# Patient Record
Sex: Male | Born: 1970 | Race: White | Hispanic: No | Smoking: Former smoker
Health system: Southern US, Community
[De-identification: ages and names within clinical notes are randomized; demographics above are authoritative.]

## PROBLEM LIST (undated history)

## (undated) DIAGNOSIS — E119 Type 2 diabetes mellitus without complications: Secondary | ICD-10-CM

## (undated) DIAGNOSIS — N179 Acute kidney failure, unspecified: Secondary | ICD-10-CM

## (undated) DIAGNOSIS — S83509A Sprain of unspecified cruciate ligament of unspecified knee, initial encounter: Secondary | ICD-10-CM

## (undated) DIAGNOSIS — Z720 Tobacco use: Secondary | ICD-10-CM

## (undated) DIAGNOSIS — K219 Gastro-esophageal reflux disease without esophagitis: Secondary | ICD-10-CM

## (undated) DIAGNOSIS — E785 Hyperlipidemia, unspecified: Secondary | ICD-10-CM

## (undated) DIAGNOSIS — F329 Major depressive disorder, single episode, unspecified: Secondary | ICD-10-CM

## (undated) DIAGNOSIS — G4730 Sleep apnea, unspecified: Secondary | ICD-10-CM

## (undated) DIAGNOSIS — F32A Depression, unspecified: Secondary | ICD-10-CM

## (undated) DIAGNOSIS — I2119 ST elevation (STEMI) myocardial infarction involving other coronary artery of inferior wall: Secondary | ICD-10-CM

## (undated) DIAGNOSIS — IMO0002 Reserved for concepts with insufficient information to code with codable children: Secondary | ICD-10-CM

## (undated) HISTORY — PX: REDUCTION OF TORSION OF TESTIS: SUR1096

## (undated) HISTORY — DX: Sleep apnea, unspecified: G47.30

## (undated) HISTORY — PX: KNEE LIGAMENT RECONSTRUCTION: SHX1895

---

## 2001-06-18 ENCOUNTER — Emergency Department (HOSPITAL_COMMUNITY): Admission: EM | Admit: 2001-06-18 | Discharge: 2001-06-18 | Payer: Self-pay | Admitting: Emergency Medicine

## 2002-10-09 ENCOUNTER — Encounter: Payer: Self-pay | Admitting: Specialist

## 2002-10-09 ENCOUNTER — Ambulatory Visit (HOSPITAL_COMMUNITY): Admission: RE | Admit: 2002-10-09 | Discharge: 2002-10-09 | Payer: Self-pay | Admitting: Specialist

## 2007-03-01 ENCOUNTER — Encounter: Admission: RE | Admit: 2007-03-01 | Discharge: 2007-03-01 | Payer: Self-pay | Admitting: Neurosurgery

## 2007-03-01 ENCOUNTER — Encounter: Admission: RE | Admit: 2007-03-01 | Discharge: 2007-03-01 | Payer: Self-pay | Admitting: Internal Medicine

## 2009-06-11 ENCOUNTER — Emergency Department (HOSPITAL_COMMUNITY): Admission: EM | Admit: 2009-06-11 | Discharge: 2009-06-12 | Payer: Self-pay | Admitting: Emergency Medicine

## 2009-07-02 ENCOUNTER — Emergency Department (HOSPITAL_COMMUNITY): Admission: EM | Admit: 2009-07-02 | Discharge: 2009-07-03 | Payer: Self-pay | Admitting: Emergency Medicine

## 2009-07-03 ENCOUNTER — Ambulatory Visit (HOSPITAL_COMMUNITY): Admission: RE | Admit: 2009-07-03 | Discharge: 2009-07-03 | Payer: Self-pay | Admitting: Psychiatry

## 2009-07-03 ENCOUNTER — Inpatient Hospital Stay (HOSPITAL_COMMUNITY): Admission: EM | Admit: 2009-07-03 | Discharge: 2009-07-05 | Payer: Self-pay | Admitting: Psychiatry

## 2009-07-03 ENCOUNTER — Ambulatory Visit: Payer: Self-pay | Admitting: Psychiatry

## 2010-12-16 LAB — BASIC METABOLIC PANEL
BUN: 8 mg/dL (ref 6–23)
Chloride: 95 mEq/L — ABNORMAL LOW (ref 96–112)
Creatinine, Ser: 0.95 mg/dL (ref 0.4–1.5)

## 2010-12-16 LAB — URINALYSIS, ROUTINE W REFLEX MICROSCOPIC
Leukocytes, UA: NEGATIVE
Nitrite: NEGATIVE
Protein, ur: 30 mg/dL — AB
Urobilinogen, UA: 0.2 mg/dL (ref 0.0–1.0)

## 2010-12-16 LAB — CBC
MCV: 92.1 fL (ref 78.0–100.0)
Platelets: 247 10*3/uL (ref 150–400)
WBC: 6.7 10*3/uL (ref 4.0–10.5)

## 2010-12-16 LAB — DIFFERENTIAL
Basophils Absolute: 0 10*3/uL (ref 0.0–0.1)
Basophils Relative: 0 % (ref 0–1)
Eosinophils Absolute: 0 10*3/uL (ref 0.0–0.7)
Lymphs Abs: 1.4 10*3/uL (ref 0.7–4.0)
Neutrophils Relative %: 70 % (ref 43–77)

## 2010-12-16 LAB — ETHANOL: Alcohol, Ethyl (B): <5 mg/dL (ref 0–10)

## 2010-12-16 LAB — URINE MICROSCOPIC-ADD ON

## 2010-12-16 LAB — RAPID URINE DRUG SCREEN, HOSP PERFORMED: Tetrahydrocannabinol: NOT DETECTED

## 2010-12-17 LAB — POCT I-STAT, CHEM 8
Calcium, Ion: 1.1 mmol/L — ABNORMAL LOW (ref 1.12–1.32)
Chloride: 97 mEq/L (ref 96–112)
Creatinine, Ser: 1 mg/dL (ref 0.4–1.5)
Glucose, Bld: 101 mg/dL — ABNORMAL HIGH (ref 70–99)
HCT: 44 % (ref 39.0–52.0)
Potassium: 3.5 mEq/L (ref 3.5–5.1)

## 2010-12-17 LAB — URINALYSIS, ROUTINE W REFLEX MICROSCOPIC
Leukocytes, UA: NEGATIVE
Nitrite: NEGATIVE
Specific Gravity, Urine: 1.007 (ref 1.005–1.030)
Urobilinogen, UA: 0.2 mg/dL (ref 0.0–1.0)
pH: 5.5 (ref 5.0–8.0)

## 2010-12-17 LAB — CBC
HCT: 42.1 % (ref 39.0–52.0)
Hemoglobin: 14.6 g/dL (ref 13.0–17.0)
MCHC: 34.7 g/dL (ref 30.0–36.0)
RBC: 4.57 MIL/uL (ref 4.22–5.81)
RDW: 11.8 % (ref 11.5–15.5)

## 2010-12-17 LAB — DIFFERENTIAL
Basophils Relative: 1 % (ref 0–1)
Lymphs Abs: 1.8 10*3/uL (ref 0.7–4.0)
Monocytes Absolute: 0.5 10*3/uL (ref 0.1–1.0)
Monocytes Relative: 6 % (ref 3–12)
Neutro Abs: 5.8 10*3/uL (ref 1.7–7.7)

## 2010-12-17 LAB — RAPID URINE DRUG SCREEN, HOSP PERFORMED
Barbiturates: NOT DETECTED
Opiates: NOT DETECTED
Tetrahydrocannabinol: NOT DETECTED

## 2012-01-24 ENCOUNTER — Other Ambulatory Visit: Payer: Self-pay | Admitting: Orthopedic Surgery

## 2012-01-24 DIAGNOSIS — M25562 Pain in left knee: Secondary | ICD-10-CM

## 2012-01-27 ENCOUNTER — Ambulatory Visit
Admission: RE | Admit: 2012-01-27 | Discharge: 2012-01-27 | Disposition: A | Discharge status: Home / Self Care | Payer: Managed Care, Other (non HMO) | Source: Ambulatory Visit | Attending: Orthopedic Surgery | Admitting: Orthopedic Surgery

## 2012-01-27 ENCOUNTER — Other Ambulatory Visit: Payer: Self-pay

## 2012-01-27 DIAGNOSIS — M25562 Pain in left knee: Secondary | ICD-10-CM

## 2012-02-11 DIAGNOSIS — IMO0002 Reserved for concepts with insufficient information to code with codable children: Secondary | ICD-10-CM

## 2012-02-11 DIAGNOSIS — S83509A Sprain of unspecified cruciate ligament of unspecified knee, initial encounter: Secondary | ICD-10-CM

## 2012-02-11 HISTORY — DX: Reserved for concepts with insufficient information to code with codable children: IMO0002

## 2012-02-11 HISTORY — DX: Sprain of unspecified cruciate ligament of unspecified knee, initial encounter: S83.509A

## 2012-02-16 ENCOUNTER — Encounter (HOSPITAL_BASED_OUTPATIENT_CLINIC_OR_DEPARTMENT_OTHER): Payer: Self-pay | Admitting: *Deleted

## 2012-02-17 ENCOUNTER — Encounter (HOSPITAL_BASED_OUTPATIENT_CLINIC_OR_DEPARTMENT_OTHER): Payer: Self-pay | Admitting: *Deleted

## 2012-02-22 NOTE — H&P (Signed)
[facility] [address]   [address] [place], [place] [address] [phone] A Division of [facility]  Loreta Ave, M.D.     Robert A. Thurston Hole, M.D.     Lunette Stands, M.D. Eulas Post, M.D.    Buford Dresser, M.D. Estell Harpin, M.D. Ralene Cork, D.O.          Genene Churn. Barry Dienes, PA-C            Kirstin A. Shepperson, PA-C Janace Litten, OPA-C   RE: Albert Villegas, Albert Villegas                                [id number]      DOB: 07/12/1971 INITIAL EVALUATION:  01-24-12 Pratik presents as a new patient to the office for evaluation and treatment recommendation for his left knee.  Recent traumatic event coming down awkwardly with a twisting injury.  His knee pivoted at that time.  Previous history of slipping on ice back in 2002.  Anterior cruciate ligament tear diagnosis and evaluated with MRI scan at [facility].  Since that time he has had some degree of instability.  This has slowly gotten more pronounced over time.  He has now progressed to a point where he has episodes of his knee giving way, even turning fast with activities of daily living.  He has been trying to put off operative intervention, but it is getting unrelenting and now with his new acute traumatic event he is getting locking and medial mechanical symptoms.  Works as a [employer].  He comes in at this time for evaluation and treatment recommendation.  In talking with him he is realizing that something is going to have to be done because his degree of symptomatic instability is getting much worse.  Otherwise in excellent healthy. History is reviewed, updated and included in the chart.       EXAMINATION: General exam is outlined and included in the chart.  Specifically, neurovascularly intact in both lower extremities.  Negative log roll, both hips.  Negative straight leg raise, both sides.  Left knee is ACL deficient with a positive Lachman and  drawer.  Other ligaments stable.  Full motion.  Point tender medial joint line.  Positive medial McMurray's.  Right knee has excellent stability.  Full motion.  No meniscal signs.    X-RAYS: Four view standing x-ray shows really a paucity of degenerative changes.  Excellent bone stock.  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  Now I think he probably has a medial meniscus tear.  He is struggling with taking time off of work to address this.  I am going to get an MRI scan to see what he has done recently.  If he has torn up his medial meniscus this is going to have to be addressed and if our hand is forced from a point of view of operative treatment, my overall recommendation would be to go ahead and do an allograft reconstruction.  He understands and agrees.  He is going to call me after the MRI is complete to make a final decision.  In the interim we have discussed allograft reconstruction, as well as meniscectomy versus meniscus repair.  Procedures, risks, benefits and complications reviewed and discussed in detail.  Paperwork complete.  All questions answered.  We have talked about how long he needs to be out of work  and how long he has to be careful after reconstruction.  All questions answered.  Final decision after we see his scan.  Continue to be cautious with turning in the interim.     Loreta Ave, M.D.   Electronically verified by Loreta Ave, M.D. DFM:jjh D 01-24-12 T 01-24-12

## 2012-02-23 ENCOUNTER — Ambulatory Visit (HOSPITAL_BASED_OUTPATIENT_CLINIC_OR_DEPARTMENT_OTHER): Payer: Managed Care, Other (non HMO) | Admitting: Anesthesiology

## 2012-02-23 ENCOUNTER — Encounter (HOSPITAL_BASED_OUTPATIENT_CLINIC_OR_DEPARTMENT_OTHER): Payer: Self-pay

## 2012-02-23 ENCOUNTER — Encounter (HOSPITAL_BASED_OUTPATIENT_CLINIC_OR_DEPARTMENT_OTHER): Payer: Self-pay | Admitting: Anesthesiology

## 2012-02-23 ENCOUNTER — Ambulatory Visit (HOSPITAL_BASED_OUTPATIENT_CLINIC_OR_DEPARTMENT_OTHER)
Admission: RE | Admit: 2012-02-23 | Discharge: 2012-02-23 | Disposition: A | Discharge status: Home / Self Care | Payer: Managed Care, Other (non HMO) | Source: Ambulatory Visit | Attending: Orthopedic Surgery | Admitting: Orthopedic Surgery

## 2012-02-23 ENCOUNTER — Encounter (HOSPITAL_BASED_OUTPATIENT_CLINIC_OR_DEPARTMENT_OTHER)
Admission: RE | Disposition: A | Discharge status: Home / Self Care | Payer: Self-pay | Source: Ambulatory Visit | Attending: Orthopedic Surgery

## 2012-02-23 DIAGNOSIS — K219 Gastro-esophageal reflux disease without esophagitis: Secondary | ICD-10-CM | POA: Insufficient documentation

## 2012-02-23 DIAGNOSIS — Z4789 Encounter for other orthopedic aftercare: Secondary | ICD-10-CM

## 2012-02-23 DIAGNOSIS — S83289A Other tear of lateral meniscus, current injury, unspecified knee, initial encounter: Secondary | ICD-10-CM | POA: Insufficient documentation

## 2012-02-23 DIAGNOSIS — IMO0002 Reserved for concepts with insufficient information to code with codable children: Secondary | ICD-10-CM | POA: Insufficient documentation

## 2012-02-23 DIAGNOSIS — S83509A Sprain of unspecified cruciate ligament of unspecified knee, initial encounter: Secondary | ICD-10-CM | POA: Insufficient documentation

## 2012-02-23 DIAGNOSIS — X500XXA Overexertion from strenuous movement or load, initial encounter: Secondary | ICD-10-CM | POA: Insufficient documentation

## 2012-02-23 HISTORY — DX: Sprain of unspecified cruciate ligament of unspecified knee, initial encounter: S83.509A

## 2012-02-23 HISTORY — DX: Gastro-esophageal reflux disease without esophagitis: K21.9

## 2012-02-23 HISTORY — DX: Depression, unspecified: F32.A

## 2012-02-23 HISTORY — DX: Reserved for concepts with insufficient information to code with codable children: IMO0002

## 2012-02-23 HISTORY — DX: Major depressive disorder, single episode, unspecified: F32.9

## 2012-02-23 LAB — POCT HEMOGLOBIN-HEMACUE: Hemoglobin: 15.6 g/dL (ref 13.0–17.0)

## 2012-02-23 SURGERY — KNEE ARTHROSCOPY WITH ANTERIOR CRUCIATE LIGAMENT (ACL) REPAIR
Anesthesia: General | Site: Knee | Laterality: Left

## 2012-02-23 MED ORDER — PROPOFOL 10 MG/ML IV EMUL
INTRAVENOUS | Status: DC | PRN
  Administered 2012-02-23: 300 mg via INTRAVENOUS

## 2012-02-23 MED ORDER — LACTATED RINGERS IV SOLN
INTRAVENOUS | Status: DC
  Administered 2012-02-23 (×2): via INTRAVENOUS

## 2012-02-23 MED ORDER — FENTANYL CITRATE 0.05 MG/ML IJ SOLN
50.0000 ug | INTRAMUSCULAR | Status: DC | PRN
  Administered 2012-02-23: 100 ug via INTRAVENOUS

## 2012-02-23 MED ORDER — ONDANSETRON HCL 4 MG/2ML IJ SOLN
INTRAMUSCULAR | Status: DC | PRN
  Administered 2012-02-23: 4 mg via INTRAVENOUS

## 2012-02-23 MED ORDER — BUPIVACAINE HCL (PF) 0.5 % IJ SOLN
INTRAMUSCULAR | Status: DC | PRN
  Administered 2012-02-23: 25 mL

## 2012-02-23 MED ORDER — OXYCODONE-ACETAMINOPHEN 5-325 MG PO TABS
1.0000 | ORAL_TABLET | ORAL | Status: DC | PRN
  Administered 2012-02-23: 1 via ORAL

## 2012-02-23 MED ORDER — DROPERIDOL 2.5 MG/ML IJ SOLN
INTRAMUSCULAR | Status: DC | PRN
  Administered 2012-02-23: 0.625 mg via INTRAVENOUS

## 2012-02-23 MED ORDER — HYDROMORPHONE HCL PF 1 MG/ML IJ SOLN
0.2500 mg | INTRAMUSCULAR | Status: DC | PRN
  Administered 2012-02-23 (×2): 0.5 mg via INTRAVENOUS

## 2012-02-23 MED ORDER — CEFAZOLIN SODIUM-DEXTROSE 2-3 GM-% IV SOLR
2.0000 g | INTRAVENOUS | Status: AC
  Administered 2012-02-23: 2 g via INTRAVENOUS

## 2012-02-23 MED ORDER — DEXAMETHASONE SODIUM PHOSPHATE 4 MG/ML IJ SOLN
INTRAMUSCULAR | Status: DC | PRN
  Administered 2012-02-23: 10 mg via INTRAVENOUS

## 2012-02-23 MED ORDER — FENTANYL CITRATE 0.05 MG/ML IJ SOLN
INTRAMUSCULAR | Status: DC | PRN
  Administered 2012-02-23: 100 ug via INTRAVENOUS
  Administered 2012-02-23: 50 ug via INTRAVENOUS

## 2012-02-23 MED ORDER — METOCLOPRAMIDE HCL 5 MG/ML IJ SOLN: 10.0000 mg | Freq: Once | INTRAMUSCULAR | Status: DC | PRN

## 2012-02-23 MED ORDER — MIDAZOLAM HCL 2 MG/2ML IJ SOLN
0.5000 mg | INTRAMUSCULAR | Status: DC | PRN
  Administered 2012-02-23: 2 mg via INTRAVENOUS

## 2012-02-23 MED ORDER — SODIUM CHLORIDE 0.9 % IR SOLN
Status: DC | PRN
  Administered 2012-02-23: 3000 mL

## 2012-02-23 MED ORDER — OXYCODONE HCL 5 MG PO TABS: 5.0000 mg | ORAL_TABLET | Freq: Once | ORAL | Status: DC | PRN

## 2012-02-23 MED ORDER — LIDOCAINE HCL (CARDIAC) 20 MG/ML IV SOLN
INTRAVENOUS | Status: DC | PRN
  Administered 2012-02-23: 40 mg via INTRAVENOUS

## 2012-02-23 SURGICAL SUPPLY — 90 items
ANCH SUT PUSHLCK 19.5X3.5 STRL (Anchor) ×1 IMPLANT
ANCHOR PUSHLOCK PEEK 3.5X19.5 (Anchor) ×1 IMPLANT
APL SKNCLS STERI-STRIP NONHPOA (GAUZE/BANDAGES/DRESSINGS) ×1
BANDAGE ELASTIC 4 VELCRO ST LF (GAUZE/BANDAGES/DRESSINGS) IMPLANT
BANDAGE ELASTIC 6 VELCRO ST LF (GAUZE/BANDAGES/DRESSINGS) IMPLANT
BANDAGE ESMARK 6X9 LF (GAUZE/BANDAGES/DRESSINGS) ×1 IMPLANT
BENZOIN TINCTURE PRP APPL 2/3 (GAUZE/BANDAGES/DRESSINGS) ×2 IMPLANT
BIOSCREW 8X25 (Screw) ×2 IMPLANT
BIOSCREW 9X25 (Screw) ×2 IMPLANT
BIT DRILL 67X1.5XWRPS STRL (BIT) ×1 IMPLANT
BIT DRILL QC 3.5X195 (BIT) ×1 IMPLANT
BIT DRL 67X1.5XWRPS STRL (BIT) ×1
BLADE 4.2CUDA (BLADE) IMPLANT
BLADE AVERAGE 25X9 (BLADE) ×2 IMPLANT
BLADE CUDA 5.5 (BLADE) IMPLANT
BLADE CUDA GRT WHITE 3.5 (BLADE) IMPLANT
BLADE CUTTER GATOR 3.5 (BLADE) ×2 IMPLANT
BLADE CUTTER MENIS 5.5 (BLADE) IMPLANT
BLADE GREAT WHITE 4.2 (BLADE) ×2 IMPLANT
BLADE SURG 15 STRL LF DISP TIS (BLADE) ×1 IMPLANT
BLADE SURG 15 STRL SS (BLADE) ×2
BNDG CMPR 9X6 STRL LF SNTH (GAUZE/BANDAGES/DRESSINGS) ×1
BNDG ESMARK 6X9 LF (GAUZE/BANDAGES/DRESSINGS) ×2
BUR EGG 3PK/BX (BURR) ×2 IMPLANT
BUR OVAL 6.0 (BURR) ×2 IMPLANT
CANISTER OMNI JUG 16 LITER (MISCELLANEOUS) ×2 IMPLANT
CANISTER SUCTION 2500CC (MISCELLANEOUS) IMPLANT
CLOTH BEACON ORANGE TIMEOUT ST (SAFETY) ×2 IMPLANT
COVER TABLE BACK 60X90 (DRAPES) ×2 IMPLANT
CUFF TOURNIQUET SINGLE 34IN LL (TOURNIQUET CUFF) ×1 IMPLANT
CUTTER MENISCUS  4.2MM (BLADE)
CUTTER MENISCUS 4.2MM (BLADE) IMPLANT
DECANTER SPIKE VIAL GLASS SM (MISCELLANEOUS) IMPLANT
DRAPE ARTHROSCOPY W/POUCH 114 (DRAPES) ×2 IMPLANT
DRAPE U-SHAPE 47X51 STRL (DRAPES) ×2 IMPLANT
DRILL BIT WIRE PASS (BIT) ×2
DURAPREP 26ML APPLICATOR (WOUND CARE) ×2 IMPLANT
ELECT MENISCUS 165MM 90D (ELECTRODE) IMPLANT
ELECT REM PT RETURN 9FT ADLT (ELECTROSURGICAL) ×2
ELECTRODE REM PT RTRN 9FT ADLT (ELECTROSURGICAL) ×1 IMPLANT
GAUZE XEROFORM 1X8 LF (GAUZE/BANDAGES/DRESSINGS) IMPLANT
GLOVE BIO SURGEON STRL SZ 6.5 (GLOVE) ×1 IMPLANT
GLOVE BIOGEL M STRL SZ7.5 (GLOVE) ×1 IMPLANT
GLOVE BIOGEL PI IND STRL 8 (GLOVE) ×1 IMPLANT
GLOVE BIOGEL PI INDICATOR 8 (GLOVE) ×1
GLOVE INDICATOR 7.0 STRL GRN (GLOVE) ×1 IMPLANT
GLOVE ORTHO TXT STRL SZ7.5 (GLOVE) ×4 IMPLANT
GOWN BRE IMP PREV XXLGXLNG (GOWN DISPOSABLE) ×2 IMPLANT
GOWN PREVENTION PLUS XLARGE (GOWN DISPOSABLE) ×3 IMPLANT
GRAFT ACHILLES CALC BNE BLCK (Bone Implant) IMPLANT
GRAFT ACHILLES TENDON (Bone Implant) ×2 IMPLANT
IMMOBILIZER KNEE 22 UNIV (SOFTGOODS) ×1 IMPLANT
IMMOBILIZER KNEE 24 THIGH 36 (MISCELLANEOUS) ×1 IMPLANT
IMMOBILIZER KNEE 24 UNIV (MISCELLANEOUS) ×2
KNEE WRAP E Z 3 GEL PACK (MISCELLANEOUS) ×2 IMPLANT
KNIFE GRAFT ACL 10MM 5952 (MISCELLANEOUS) IMPLANT
KNIFE GRAFT ACL 9MM (MISCELLANEOUS) IMPLANT
NEEDLE MENISCAL REPAIR DBL ARM (NEEDLE) IMPLANT
NS IRRIG 1000ML POUR BTL (IV SOLUTION) ×2 IMPLANT
PACK ARTHROSCOPY DSU (CUSTOM PROCEDURE TRAY) ×2 IMPLANT
PACK BASIN DAY SURGERY FS (CUSTOM PROCEDURE TRAY) ×2 IMPLANT
PAD CAST 4YDX4 CTTN HI CHSV (CAST SUPPLIES) ×1 IMPLANT
PADDING CAST COTTON 4X4 STRL (CAST SUPPLIES) ×2
PADDING CAST COTTON 6X4 STRL (CAST SUPPLIES) ×2 IMPLANT
PASSER SUT SWANSON 36MM LOOP (INSTRUMENTS) ×1 IMPLANT
PENCIL BUTTON HOLSTER BLD 10FT (ELECTRODE) ×2 IMPLANT
SCREW BIO 8X25 (Screw) IMPLANT
SCREW BIO 9X25 (Screw) IMPLANT
SET ARTHROSCOPY TUBING (MISCELLANEOUS) ×2
SET ARTHROSCOPY TUBING LN (MISCELLANEOUS) ×1 IMPLANT
SLEEVE SCD COMPRESS KNEE MED (MISCELLANEOUS) ×1 IMPLANT
SPONGE GAUZE 4X4 12PLY (GAUZE/BANDAGES/DRESSINGS) ×4 IMPLANT
SPONGE LAP 4X18 X RAY DECT (DISPOSABLE) ×1 IMPLANT
STRIP CLOSURE SKIN 1/2X4 (GAUZE/BANDAGES/DRESSINGS) ×2 IMPLANT
SUCTION FRAZIER TIP 10 FR DISP (SUCTIONS) IMPLANT
SUT 2 FIBERLOOP 20 STRT BLUE (SUTURE) ×2
SUT ETHILON 3 0 PS 1 (SUTURE) IMPLANT
SUT FIBERWIRE #2 38 T-5 BLUE (SUTURE) ×8
SUT VIC AB 0 CT1 27 (SUTURE)
SUT VIC AB 0 CT1 27XBRD ANBCTR (SUTURE) IMPLANT
SUT VIC AB 2-0 SH 27 (SUTURE) ×2
SUT VIC AB 2-0 SH 27XBRD (SUTURE) ×1 IMPLANT
SUT VIC AB 3-0 SH 27 (SUTURE) ×2
SUT VIC AB 3-0 SH 27X BRD (SUTURE) ×1 IMPLANT
SUT VICRYL 4-0 PS2 18IN ABS (SUTURE) IMPLANT
SUTURE 2 FIBERLOOP 20 STRT BLU (SUTURE) ×1 IMPLANT
SUTURE FIBERWR #2 38 T-5 BLUE (SUTURE) ×4 IMPLANT
TOWEL OR 17X24 6PK STRL BLUE (TOWEL DISPOSABLE) ×2 IMPLANT
TOWEL OR NON WOVEN STRL DISP B (DISPOSABLE) ×2 IMPLANT
WATER STERILE IRR 1000ML POUR (IV SOLUTION) ×2 IMPLANT

## 2012-02-23 NOTE — Transfer of Care (Signed)
Immediate Anesthesia Transfer of Care Note  Patient: Albert Villegas  Procedure(s) Performed: Procedure(s) (LRB): KNEE ARTHROSCOPY WITH ANTERIOR CRUCIATE LIGAMENT (ACL) REPAIR (Left)  Patient Location: PACU  Anesthesia Type: GA combined with regional for post-op pain  Level of Consciousness: awake and oriented  Airway & Oxygen Therapy: Patient Spontanous Breathing and Patient connected to face mask  Post-op Assessment: Report given to PACU RN and Post -op Vital signs reviewed and stable  Post vital signs: Reviewed and stable  Complications: No apparent anesthesia complications

## 2012-02-23 NOTE — Discharge Instructions (Signed)
°  Post Anesthesia Home Care Instructions ° °Activity: °Get plenty of rest for the remainder of the day. A responsible adult should stay with you for 24 hours following the procedure.  °For the next 24 hours, DO NOT: °-Drive a car °-Operate machinery °-Drink alcoholic beverages °-Take any medication unless instructed by your physician °-Make any legal decisions or sign important papers. ° °Meals: °Start with liquid foods such as gelatin or soup. Progress to regular foods as tolerated. Avoid greasy, spicy, heavy foods. If nausea and/or vomiting occur, drink only clear liquids until the nausea and/or vomiting subsides. Call your physician if vomiting continues. ° °Special Instructions/Symptoms: °Your throat may feel dry or sore from the anesthesia or the breathing tube placed in your throat during surgery. If this causes discomfort, gargle with warm salt water. The discomfort should disappear within 24 hours. ° °Regional Anesthesia Blocks ° °1. Numbness or the inability to move the "blocked" extremity may last from 3-48 hours after placement. The length of time depends on the medication injected and your individual response to the medication. If the numbness is not going away after 48 hours, call your surgeon. ° °2. The extremity that is blocked will need to be protected until the numbness is gone and the  Strength has returned. Because you cannot feel it, you will need to take extra care to avoid injury. Because it may be weak, you may have difficulty moving it or using it. You may not know what position it is in without looking at it while the block is in effect. ° °3. For blocks in the legs and feet, returning to weight bearing and walking needs to be done carefully. You will need to wait until the numbness is entirely gone and the strength has returned. You should be able to move your leg and foot normally before you try and bear weight or walk. You will need someone to be with you when you first try to ensure you  do not fall and possibly risk injury. ° °4. Bruising and tenderness at the needle site are common side effects and will resolve in a few days. ° °5. Persistent numbness or new problems with movement should be communicated to the surgeon or the [facility] ([phone])/ [facility] ([phone]). °

## 2012-02-23 NOTE — Op Note (Signed)
NAMEKEVIN, SPACE NO.:  [id number]  MEDICAL RECORD NO.:  [id number]  LOCATION:                                 FACILITY:  PHYSICIAN:  Loreta Ave, M.D.      DATE OF BIRTH:  DATE OF PROCEDURE:  02/23/2012 DATE OF DISCHARGE:                              OPERATIVE REPORT   PREOPERATIVE DIAGNOSES:  Left knee acute on chronic anterior cruciate ligament tear with marked anterolateral rotary instability.  Medial and lateral meniscus tear.  POSTOPERATIVE DIAGNOSES:  Left knee acute on chronic anterior cruciate ligament tear with marked anterolateral rotary instability.  Medial and lateral meniscus tear.  Longitudinal tearing lateral meniscus, not peripheral, not reparable.  Posterior third.  Small under surface tear, posterior horn medial meniscus not displaceable.  PROCEDURE:  Left knee exam under anesthesia, arthroscopy.  Partial lateral meniscectomy.  Scarification of undersurface tear, medial meniscus.  Arthroscopic endoscopic ACL reconstruction utilizing Achilles allograft.  Bioabsorbable screw fixation above below.  Push lock fixation distally.  Notchplasty.  SURGEON:  Loreta Ave, MD  ASSISTANT:  Genene Churn. Denton Meek., present throughout the entire case, necessary for timely completion of procedure.  ANESTHESIA:  General.  BLOOD LOSS:  Minimal.  SPECIMENS:  None.  CULTURES:  None.  COMPLICATION:  None.  DRESSINGS:  Soft compressive knee immobilizer.  DRAINS:  Not utilized.  TOURNIQUET TIME:  45 minutes.  PROCEDURE:  The patient was brought to operating room, placed on the operating table in supine position.  After adequate anesthesia had been obtained, leg examined.  Positive Lachman, positive drawer, positive pivot shift.  Tourniquet applied.  Prepped and draped in usual sterile fashion.  Exsanguinated with elevation and Esmarch.  Tourniquet inflated to 350 mmHg.  Two portals, 1 each medial and lateral parapatellar. Arthroscope  introduced.  Knee distended and inspected.  Whatever was left to the ACL from previous injury is not completely gone, shredded. Really no functional tissue at all.  Debrided out.  A little narrowing of the notch not extreme.  Open notchplasty.  PCL intact.  Other compartments actually looked good.  Little grade 2 changes on patella, nothing marked.  Lateral meniscus at 2 linear tears out away from the back edge.  Posterior 3rd.  Taken down to a stable rim, tapered into remaining meniscus.  Medially there was a very small undersurface tear centimeter long that was just barely in the meniscus, not displaced at all.  Scarified.  I did not feel this was involved enough to require a suture or debridement.  A graft was prepared for 10 mm tunnels.  With the arthroscope in place, small incision next to tibial tubercle. Guidewire driven from there up through the footprint of the ACL. Overdrilled with a 10 mm reamer.  Femoral guide inserted across tibial tunnel notch and on the back cortex of femur.  K-wire driven. Overdrilled with 10 mm reamer for appropriate depth of the graft and pegs.  Debris cleared throughout the knee.  Tunnels assessed, found to be in good position.  Two pin Passer inserted across both tunnels and out through a stab wound anterolateral thigh.  Nitinol wire brought to the medial portal out  through the femoral tunnel.  Graft attached to tubing Passer, pulled in the bony part of the graft up into the femoral tunnel.  Fixed over Nitinol wire with an 8 x 25 bioabsorbable screw. Good capturing of fixation confirmed.  Knee placed at 70 degrees of flexion.  Graft tightened.  Fixed in the tibial tunnel with a 9 x 25 screw.  Exiting sutures then anchored into a predrilled 3.5 mm push lock anchor.  At completion, excellent capturing of fixation of graft above and below.  Good clearance of graft when viewed arthroscopically.  Grade stability.  All wounds irrigated and closed with  subcutaneous and subcuticular Vicryl.  Portals closed with nylon.  Sterile compressive dressing applied.  Tourniquet deflated and removed.  Knee immobilizer applied.  Anesthesia reversed.  Brought to recovery room.  Tolerated surgery well.  No complications.     Loreta Ave, M.D.     DFM/MEDQ  D:  02/23/2012  T:  02/23/2012  Job:  [id number]

## 2012-02-23 NOTE — Anesthesia Procedure Notes (Addendum)
Anesthesia Regional Block:  Femoral nerve block  Pre-Anesthetic Checklist: ,, timeout performed, Correct Patient, Correct Site, Correct Laterality, Correct Procedure, Correct Position, site marked, Risks and benefits discussed,  Surgical consent,  Pre-op evaluation,  At surgeon's request and post-op pain management  Laterality: Left  Prep: chloraprep       Needles:   Needle Type: Other   (Arrow Echogenic)   Needle Length: 9cm  Needle Gauge: 21    Additional Needles:  Procedures: ultrasound guided Femoral nerve block Narrative:  Start time: 02/23/2012 7:54 AM End time: 02/23/2012 8:02 AM Injection made incrementally with aspirations every 5 mL.  Performed by: Personally  Anesthesiologist: C. Frederick MD  Additional Notes: Ultrasound guidance used to: id relevant anatomy, confirm needle position, local anesthetic spread, avoidance of vascular puncture. Picture saved. No complications. Block performed personally by Janetta Hora. Gelene Mink, MD    Femoral nerve block Procedure Name: LMA Insertion Performed by: Lance Coon Pre-anesthesia Checklist: Patient identified, Timeout performed, Emergency Drugs available, Suction available and Patient being monitored Patient Re-evaluated:Patient Re-evaluated prior to inductionOxygen Delivery Method: Circle system utilized Preoxygenation: Pre-oxygenation with 100% oxygen Intubation Type: IV induction Ventilation: Mask ventilation without difficulty LMA: LMA inserted LMA Size: 5.0 Number of attempts: 1 Placement Confirmation: breath sounds checked- equal and bilateral and positive ETCO2 Dental Injury: Teeth and Oropharynx as per pre-operative assessment

## 2012-02-23 NOTE — Brief Op Note (Signed)
02/23/2012  10:22 AM  PATIENT:  Sofie Hartigan Loftus  41 y.o. male  PRE-OPERATIVE DIAGNOSIS:  left knee dislocation, tear of medial cartilage or meniscus; sprain/strain/tear knee cruciate ligament  POST-OPERATIVE DIAGNOSIS:  left knee dislocation, tear of medial cartilage or meniscus;  PROCEDURE:  Procedure(s) (LRB): KNEE ARTHROSCOPY WITH ANTERIOR CRUCIATE LIGAMENT (ACL) REPAIR (Left), and partial lat meniscectomy  SURGEON:  Surgeon(s) and Role:    * Loreta Ave, MD - Primary  PHYSICIAN ASSISTANT: Zonia Kief M   ANESTHESIA:   regional and general  EBL:  Total I/O In: 1300 [I.V.:1300] Out: -    SPECIMEN:  No Specimen  DISPOSITION OF SPECIMEN:  N/A  COUNTS:  YES  TOURNIQUET:  * Missing tourniquet times found for documented tourniquets in log:  40150 *   PATIENT DISPOSITION:  PACU - hemodynamically stable.

## 2012-02-23 NOTE — Anesthesia Preprocedure Evaluation (Signed)
Anesthesia Evaluation  Patient identified by MRN, date of birth, ID band Patient awake    Reviewed: Allergy & Precautions, H&P , NPO status , Patient's Chart, lab work & pertinent test results, reviewed documented beta blocker date and time   Airway Mallampati: II TM Distance: >3 FB Neck ROM: full    Dental   Pulmonary neg pulmonary ROS,          Cardiovascular negative cardio ROS      Neuro/Psych negative neurological ROS  negative psych ROS   GI/Hepatic Neg liver ROS, GERD-  Medicated and Controlled,  Endo/Other  negative endocrine ROS  Renal/GU negative Renal ROS  negative genitourinary   Musculoskeletal   Abdominal   Peds  Hematology negative hematology ROS (+)   Anesthesia Other Findings See surgeon's H&P   Reproductive/Obstetrics negative OB ROS                           Anesthesia Physical Anesthesia Plan  ASA: I  Anesthesia Plan: General   Post-op Pain Management:    Induction:   Airway Management Planned: LMA  Additional Equipment:   Intra-op Plan:   Post-operative Plan: Extubation in OR  Informed Consent: I have reviewed the patients History and Physical, chart, labs and discussed the procedure including the risks, benefits and alternatives for the proposed anesthesia with the patient or authorized representative who has indicated his/her understanding and acceptance.   Dental Advisory Given  Plan Discussed with: CRNA and Surgeon  Anesthesia Plan Comments:         Anesthesia Quick Evaluation

## 2012-02-23 NOTE — Anesthesia Postprocedure Evaluation (Signed)
Anesthesia Post Note  Patient: Albert Villegas  Procedure(s) Performed: Procedure(s) (LRB): KNEE ARTHROSCOPY WITH ANTERIOR CRUCIATE LIGAMENT (ACL) REPAIR (Left)  Anesthesia type: General  Patient location: PACU  Post pain: Pain level controlled  Post assessment: Patient's Cardiovascular Status Stable  Last Vitals:  Filed Vitals:   02/23/12 1113  BP: 162/99  Pulse: 96  Temp:   Resp: 16    Post vital signs: Reviewed and stable  Level of consciousness: alert  Complications: No apparent anesthesia complications

## 2012-02-23 NOTE — Progress Notes (Signed)
Assisted Dr. Frederick with left, ultrasound guided, femoral block. Side rails up, monitors on throughout procedure. See vital signs in flow sheet. Tolerated Procedure well. 

## 2012-02-23 NOTE — Interval H&P Note (Signed)
History and Physical Interval Note:  02/23/2012 7:35 AM  Albert Villegas  has presented today for surgery, with the diagnosis of left knee dislocation, tear of medial cartilage or meniscus; sprain/strain/tear knee cruciate ligament  The various methods of treatment have been discussed with the patient and family. After consideration of risks, benefits and other options for treatment, the patient has consented to  Procedure(s) (LRB): KNEE ARTHROSCOPY WITH ANTERIOR CRUCIATE LIGAMENT (ACL) REPAIR (Left) as a surgical intervention .  The patients' history has been reviewed, patient examined, no change in status, stable for surgery.  I have reviewed the patients' chart and labs.  Questions were answered to the patient's satisfaction.     Norrine Ballester F

## 2012-11-28 ENCOUNTER — Telehealth: Payer: Self-pay | Admitting: *Deleted

## 2012-11-28 NOTE — Telephone Encounter (Signed)
Called patient to rescheduled appointment from 12/31/12,patient stated that he rarely  uses CPAP machine and thought there was not enough data for a visit, planning to return machine.

## 2012-12-14 ENCOUNTER — Encounter: Payer: Self-pay | Admitting: Neurology

## 2013-07-18 ENCOUNTER — Other Ambulatory Visit: Payer: Self-pay

## 2013-09-30 ENCOUNTER — Ambulatory Visit (INDEPENDENT_AMBULATORY_CARE_PROVIDER_SITE_OTHER): Payer: PRIVATE HEALTH INSURANCE | Admitting: Licensed Clinical Social Worker

## 2013-09-30 DIAGNOSIS — F3173 Bipolar disorder, in partial remission, most recent episode manic: Secondary | ICD-10-CM

## 2013-10-28 ENCOUNTER — Ambulatory Visit (INDEPENDENT_AMBULATORY_CARE_PROVIDER_SITE_OTHER): Payer: PRIVATE HEALTH INSURANCE | Admitting: Licensed Clinical Social Worker

## 2013-10-28 DIAGNOSIS — F3173 Bipolar disorder, in partial remission, most recent episode manic: Secondary | ICD-10-CM

## 2014-03-21 ENCOUNTER — Inpatient Hospital Stay (HOSPITAL_COMMUNITY)
Admission: EM | Admit: 2014-03-21 | Discharge: 2014-03-24 | DRG: 247 | Disposition: A | Discharge status: Home / Self Care | Payer: PRIVATE HEALTH INSURANCE | Attending: Cardiovascular Disease | Admitting: Cardiovascular Disease

## 2014-03-21 ENCOUNTER — Emergency Department (HOSPITAL_COMMUNITY): Payer: PRIVATE HEALTH INSURANCE

## 2014-03-21 ENCOUNTER — Encounter (HOSPITAL_COMMUNITY): Payer: Self-pay | Admitting: Emergency Medicine

## 2014-03-21 DIAGNOSIS — F319 Bipolar disorder, unspecified: Secondary | ICD-10-CM | POA: Diagnosis present

## 2014-03-21 DIAGNOSIS — I158 Other secondary hypertension: Secondary | ICD-10-CM

## 2014-03-21 DIAGNOSIS — IMO0002 Reserved for concepts with insufficient information to code with codable children: Secondary | ICD-10-CM

## 2014-03-21 DIAGNOSIS — E7800 Pure hypercholesterolemia, unspecified: Secondary | ICD-10-CM | POA: Diagnosis present

## 2014-03-21 DIAGNOSIS — I2510 Atherosclerotic heart disease of native coronary artery without angina pectoris: Secondary | ICD-10-CM | POA: Diagnosis present

## 2014-03-21 DIAGNOSIS — F411 Generalized anxiety disorder: Secondary | ICD-10-CM | POA: Diagnosis present

## 2014-03-21 DIAGNOSIS — F17200 Nicotine dependence, unspecified, uncomplicated: Secondary | ICD-10-CM | POA: Diagnosis present

## 2014-03-21 DIAGNOSIS — G4733 Obstructive sleep apnea (adult) (pediatric): Secondary | ICD-10-CM | POA: Diagnosis present

## 2014-03-21 DIAGNOSIS — I10 Essential (primary) hypertension: Secondary | ICD-10-CM

## 2014-03-21 DIAGNOSIS — I2111 ST elevation (STEMI) myocardial infarction involving right coronary artery: Secondary | ICD-10-CM

## 2014-03-21 DIAGNOSIS — E1165 Type 2 diabetes mellitus with hyperglycemia: Secondary | ICD-10-CM

## 2014-03-21 DIAGNOSIS — F1010 Alcohol abuse, uncomplicated: Secondary | ICD-10-CM

## 2014-03-21 DIAGNOSIS — I213 ST elevation (STEMI) myocardial infarction of unspecified site: Secondary | ICD-10-CM

## 2014-03-21 DIAGNOSIS — Z720 Tobacco use: Secondary | ICD-10-CM | POA: Diagnosis present

## 2014-03-21 DIAGNOSIS — Z955 Presence of coronary angioplasty implant and graft: Secondary | ICD-10-CM

## 2014-03-21 DIAGNOSIS — K219 Gastro-esophageal reflux disease without esophagitis: Secondary | ICD-10-CM | POA: Diagnosis present

## 2014-03-21 DIAGNOSIS — E860 Dehydration: Secondary | ICD-10-CM | POA: Diagnosis present

## 2014-03-21 DIAGNOSIS — I2119 ST elevation (STEMI) myocardial infarction involving other coronary artery of inferior wall: Principal | ICD-10-CM | POA: Diagnosis present

## 2014-03-21 DIAGNOSIS — N179 Acute kidney failure, unspecified: Secondary | ICD-10-CM

## 2014-03-21 DIAGNOSIS — N058 Unspecified nephritic syndrome with other morphologic changes: Secondary | ICD-10-CM | POA: Diagnosis present

## 2014-03-21 DIAGNOSIS — E785 Hyperlipidemia, unspecified: Secondary | ICD-10-CM

## 2014-03-21 DIAGNOSIS — E1129 Type 2 diabetes mellitus with other diabetic kidney complication: Secondary | ICD-10-CM

## 2014-03-21 HISTORY — DX: Tobacco use: Z72.0

## 2014-03-21 HISTORY — DX: Type 2 diabetes mellitus without complications: E11.9

## 2014-03-21 HISTORY — DX: Acute kidney failure, unspecified: N17.9

## 2014-03-21 HISTORY — DX: Hyperlipidemia, unspecified: E78.5

## 2014-03-21 HISTORY — DX: ST elevation (STEMI) myocardial infarction involving other coronary artery of inferior wall: I21.19

## 2014-03-21 LAB — CBC
HEMATOCRIT: 43.6 % (ref 39.0–52.0)
Hemoglobin: 15.2 g/dL (ref 13.0–17.0)
MCH: 31.7 pg (ref 26.0–34.0)
MCHC: 34.9 g/dL (ref 30.0–36.0)
MCV: 91 fL (ref 78.0–100.0)
PLATELETS: 374 10*3/uL (ref 150–400)
RBC: 4.79 MIL/uL (ref 4.22–5.81)
RDW: 12.3 % (ref 11.5–15.5)
WBC: 16.6 10*3/uL — AB (ref 4.0–10.5)

## 2014-03-21 LAB — I-STAT TROPONIN, ED: TROPONIN I, POC: 0 ng/mL (ref 0.00–0.08)

## 2014-03-21 MED ORDER — ASPIRIN 81 MG PO CHEW
CHEWABLE_TABLET | ORAL | Status: AC
  Filled 2014-03-21: qty 4

## 2014-03-21 MED ORDER — SODIUM CHLORIDE 0.9 % IV SOLN
INTRAVENOUS | Status: DC
  Administered 2014-03-21: via INTRAVENOUS

## 2014-03-21 MED ORDER — METOPROLOL TARTRATE 1 MG/ML IV SOLN
INTRAVENOUS | Status: AC
  Filled 2014-03-21: qty 5

## 2014-03-21 MED ORDER — HEPARIN SODIUM (PORCINE) 5000 UNIT/ML IJ SOLN
4000.0000 [IU] | INTRAMUSCULAR | Status: AC
  Administered 2014-03-21: 4000 [IU] via INTRAVENOUS
  Filled 2014-03-21: qty 0.8

## 2014-03-21 MED ORDER — MORPHINE SULFATE 2 MG/ML IJ SOLN
INTRAMUSCULAR | Status: AC
  Administered 2014-03-21: 2 mg via INTRAVENOUS
  Filled 2014-03-21: qty 1

## 2014-03-21 MED ORDER — ASPIRIN 81 MG PO CHEW
162.0000 mg | CHEWABLE_TABLET | Freq: Once | ORAL | Status: AC
  Administered 2014-03-21: 162 mg via ORAL

## 2014-03-21 MED ORDER — NITROGLYCERIN 0.4 MG SL SUBL
SUBLINGUAL_TABLET | SUBLINGUAL | Status: AC
  Filled 2014-03-21: qty 1

## 2014-03-21 NOTE — ED Notes (Signed)
Initial and repeat EKG given to EDP Hyacinth Meeker for review

## 2014-03-21 NOTE — ED Provider Notes (Signed)
CSN: [id number]     Arrival date & time 03/21/14  2320 History   First MD Initiated Contact with Patient 03/21/14 2339     Chief Complaint  Patient presents with  . Chest Pain     (Consider location/radiation/quality/duration/timing/severity/associated sxs/prior Treatment) HPI Comments: 43 year old male, history of hypercholesterolemia, hypertension, tobacco abuse, early heart disease in his family members and borderline diabetes. He presents from home after having acute onset of sharp and stabbing mid chest pain with significant discomfort in his left arm which was occurred at rest while he was laying down, this is severe, persistent, nothing seems to make this better or worse. He denies any associated shortness of breath or nausea, he has not had any exertional symptoms in the past. He had 162 mg of aspirin prior to arrival.  Patient is a 43 y.o. male presenting with chest pain. The history is provided by the patient and the spouse.  Chest Pain   Past Medical History  Diagnosis Date  . Tear of medial cartilage or meniscus of knee, current 02/2012    left  . Sprain and strain of cruciate ligament of knee 02/2012    left ACL  . GERD (gastroesophageal reflux disease)   . Depression    Past Surgical History  Procedure Laterality Date  . Reduction of torsion of testis  age 43  . Knee ligament reconstruction     No family history on file. History  Substance Use Topics  . Smoking status: Current Every Day Smoker -- 1.00 packs/day for 15 years    Types: Cigarettes  . Smokeless tobacco: Never Used  . Alcohol Use: Yes     Comment: occasionally    Review of Systems  Cardiovascular: Positive for chest pain.  All other systems reviewed and are negative.     Allergies  Review of patient's allergies indicates no known allergies.  Home Medications   Prior to Admission medications   Medication Sig Start Date End Date Taking? Authorizing Provider  desvenlafaxine (PRISTIQ) 50 MG 24  hr tablet Take 50 mg by mouth daily. AM   Yes Historical Provider, MD  omeprazole (PRILOSEC OTC) 20 MG tablet Take 20 mg by mouth every evening. PM   Yes Historical Provider, MD   BP 158/57  Pulse 84  Temp(Src) 97.8 F (36.6 C) (Oral)  Resp 26  Ht 5\' 9"  (1.753 m)  Wt 221 lb 5.5 oz (100.4 kg)  BMI 32.67 kg/m2  SpO2 97% Physical Exam  Nursing note and vitals reviewed. Constitutional: He appears well-developed and well-nourished. He appears distressed.  HENT:  Head: Normocephalic and atraumatic.  Mouth/Throat: Oropharynx is clear and moist. No oropharyngeal exudate.  Eyes: Conjunctivae and EOM are normal. Pupils are equal, round, and reactive to light. Right eye exhibits no discharge. Left eye exhibits no discharge. No scleral icterus.  Neck: Normal range of motion. Neck supple. No JVD present. No thyromegaly present.  Cardiovascular: Normal rate, regular rhythm, normal heart sounds and intact distal pulses.  Exam reveals no gallop and no friction rub.   No murmur heard. Pulmonary/Chest: Effort normal and breath sounds normal. No respiratory distress. He has no wheezes. He has no rales.  Abdominal: Soft. Bowel sounds are normal. He exhibits no distension and no mass. There is no tenderness.  Musculoskeletal: Normal range of motion. He exhibits no edema and no tenderness.  Lymphadenopathy:    He has no cervical adenopathy.  Neurological: He is alert. Coordination normal.  Skin: Skin is warm. No rash noted. He  is diaphoretic. No erythema.  Psychiatric: He has a normal mood and affect. His behavior is normal.    ED Course  Procedures (including critical care time) Labs Review Labs Reviewed  CBC - Abnormal; Notable for the following:    WBC 16.6 (*)    All other components within normal limits  COMPREHENSIVE METABOLIC PANEL - Abnormal; Notable for the following:    Sodium 134 (*)    Chloride 87 (*)    Glucose, Bld 253 (*)    BUN 26 (*)    Creatinine, Ser 3.29 (*)    Calcium 11.7  (*)    AST 99 (*)    GFR calc non Af Amer 22 (*)    GFR calc Af Amer 25 (*)    Anion gap 22 (*)    All other components within normal limits  TROPONIN I - Abnormal; Notable for the following:    Troponin I 2.21 (*)    All other components within normal limits  COMPREHENSIVE METABOLIC PANEL - Abnormal; Notable for the following:    Sodium 134 (*)    Chloride 93 (*)    Glucose, Bld 154 (*)    BUN 24 (*)    Creatinine, Ser 2.53 (*)    AST 117 (*)    Alkaline Phosphatase 38 (*)    GFR calc non Af Amer 30 (*)    GFR calc Af Amer 35 (*)    Anion gap 18 (*)    All other components within normal limits  LIPID PANEL - Abnormal; Notable for the following:    Cholesterol 318 (*)    Triglycerides 727 (*)    HDL 21 (*)    All other components within normal limits  CREATININE, SERUM - Abnormal; Notable for the following:    Creatinine, Ser 2.55 (*)    GFR calc non Af Amer 30 (*)    GFR calc Af Amer 35 (*)    All other components within normal limits  MRSA PCR SCREENING  APTT  PROTIME-INR  ETHANOL  CBC  URINE RAPID DRUG SCREEN (HOSP PERFORMED)  TROPONIN I  TROPONIN I  URINALYSIS, ROUTINE W REFLEX MICROSCOPIC  HEMOGLOBIN A1C  I-STAT TROPOININ, ED    Imaging Review Dg Chest Portable 1 View  03/22/2014   CLINICAL DATA:  Severe Mid chest pain for 1 hr.  EXAM: PORTABLE CHEST - 1 VIEW  COMPARISON:  None.  FINDINGS: Shallow inspiration. Normal heart size and pulmonary vascularity. No focal airspace disease or consolidation in the lungs. No blunting of costophrenic angles. No pneumothorax. Old left rib fractures.  IMPRESSION: No active disease.   Electronically Signed   By: Burman NievesWilliam  Stevens M.D.   On: 03/22/2014 00:00     EKG Interpretation   Date/Time:  Friday March 21 2014 23:31:22 EDT Ventricular Rate:  92 PR Interval:  131 QRS Duration: 105 QT Interval:  343 QTC Calculation: 424 R Axis:   81 Text Interpretation:   STEMI Sinus rhythm   inferior elevation  abn ECG.  Since last  tracing ** ** ACUTE MI / STEMI ** ** NOW PRESENT Sinus rhythm  Left atrial enlargement Inferior infarct, acute (RCA) Lateral leads are  also involved Probable RV involvement, suggest recording right precordial  leads Confirmed by Brelynn Wheller  MD, Ming Kunka ([id number]) on 03/21/2014 11:44:05 PM      MDM   Final diagnoses:  ST elevation myocardial infarction (STEMI), unspecified artery    The patient appears uncomfortable, diaphoretic and has an EKG which is  consistent with an ST elevation MI with inferior lead ST elevations. Code 70 was activated immediately, the patient will be given 162 mg of aspirin, heparin and will discuss with cardiology for transfer to cardiac center. The patient is critically ill.  And discussed with Dr. Excell Seltzer, except transfer the patient to cardiac center  CRITICAL CARE Performed by: [employer] D Total critical care time: 35 Critical care time was exclusive of separately billable procedures and treating other patients. Critical care was necessary to treat or prevent imminent or life-threatening deterioration. Critical care was time spent personally by me on the following activities: development of treatment plan with patient and/or surrogate as well as nursing, discussions with consultants, evaluation of patient's response to treatment, examination of patient, obtaining history from patient or surrogate, ordering and performing treatments and interventions, ordering and review of laboratory studies, ordering and review of radiographic studies, pulse oximetry and re-evaluation of patient's condition.   Vida Roller, MD 03/22/14 [phone]

## 2014-03-21 NOTE — ED Notes (Signed)
Per pt report: pt c/o left chest pain that began around 45 minutes ago.  Pt reports he was laying in bed at the time.  Pt denies having this pain in the past. Pt reports pain radiates down his left. Pt does endorse having an episode of dizziness and vomiting.  Pt denies SOB or lightheadedness.  Pt a/o x 4. No diaphoresis noted.

## 2014-03-22 ENCOUNTER — Inpatient Hospital Stay (HOSPITAL_COMMUNITY): Payer: PRIVATE HEALTH INSURANCE

## 2014-03-22 ENCOUNTER — Encounter (HOSPITAL_COMMUNITY)
Admission: EM | Disposition: A | Discharge status: Home / Self Care | Payer: PRIVATE HEALTH INSURANCE | Source: Home / Self Care | Attending: Cardiovascular Disease

## 2014-03-22 DIAGNOSIS — N179 Acute kidney failure, unspecified: Secondary | ICD-10-CM

## 2014-03-22 DIAGNOSIS — E785 Hyperlipidemia, unspecified: Secondary | ICD-10-CM

## 2014-03-22 DIAGNOSIS — I369 Nonrheumatic tricuspid valve disorder, unspecified: Secondary | ICD-10-CM

## 2014-03-22 DIAGNOSIS — I2119 ST elevation (STEMI) myocardial infarction involving other coronary artery of inferior wall: Secondary | ICD-10-CM

## 2014-03-22 DIAGNOSIS — I219 Acute myocardial infarction, unspecified: Secondary | ICD-10-CM

## 2014-03-22 DIAGNOSIS — I10 Essential (primary) hypertension: Secondary | ICD-10-CM | POA: Diagnosis present

## 2014-03-22 DIAGNOSIS — I2109 ST elevation (STEMI) myocardial infarction involving other coronary artery of anterior wall: Secondary | ICD-10-CM

## 2014-03-22 DIAGNOSIS — F1010 Alcohol abuse, uncomplicated: Secondary | ICD-10-CM | POA: Diagnosis present

## 2014-03-22 DIAGNOSIS — I2510 Atherosclerotic heart disease of native coronary artery without angina pectoris: Secondary | ICD-10-CM

## 2014-03-22 HISTORY — PX: CORONARY ANGIOPLASTY WITH STENT PLACEMENT: SHX49

## 2014-03-22 HISTORY — PX: LEFT HEART CATHETERIZATION WITH CORONARY ANGIOGRAM: SHX5451

## 2014-03-22 HISTORY — DX: Acute kidney failure, unspecified: N17.9

## 2014-03-22 HISTORY — DX: ST elevation (STEMI) myocardial infarction involving other coronary artery of inferior wall: I21.19

## 2014-03-22 LAB — URINALYSIS, ROUTINE W REFLEX MICROSCOPIC
Bilirubin Urine: NEGATIVE
Glucose, UA: >1000 mg/dL — AB
Ketones, ur: NEGATIVE mg/dL
LEUKOCYTES UA: NEGATIVE
Nitrite: NEGATIVE
PH: 5 (ref 5.0–8.0)
Protein, ur: NEGATIVE mg/dL
SPECIFIC GRAVITY, URINE: 1.04 — AB (ref 1.005–1.030)
Urobilinogen, UA: 0.2 mg/dL (ref 0.0–1.0)

## 2014-03-22 LAB — APTT: APTT: 27 s (ref 24–37)

## 2014-03-22 LAB — COMPREHENSIVE METABOLIC PANEL
ALBUMIN: 4.9 g/dL (ref 3.5–5.2)
ALT: 52 U/L (ref 0–53)
ALT: 53 U/L (ref 0–53)
ANION GAP: 18 — AB (ref 5–15)
ANION GAP: 22 — AB (ref 5–15)
AST: 117 U/L — ABNORMAL HIGH (ref 0–37)
AST: 99 U/L — AB (ref 0–37)
Albumin: 4.1 g/dL (ref 3.5–5.2)
Alkaline Phosphatase: 38 U/L — ABNORMAL LOW (ref 39–117)
Alkaline Phosphatase: 44 U/L (ref 39–117)
BUN: 24 mg/dL — ABNORMAL HIGH (ref 6–23)
BUN: 26 mg/dL — AB (ref 6–23)
CO2: 23 meq/L (ref 19–32)
CO2: 25 mEq/L (ref 19–32)
CREATININE: 3.29 mg/dL — AB (ref 0.50–1.35)
Calcium: 10.4 mg/dL (ref 8.4–10.5)
Calcium: 11.7 mg/dL — ABNORMAL HIGH (ref 8.4–10.5)
Chloride: 87 mEq/L — ABNORMAL LOW (ref 96–112)
Chloride: 93 mEq/L — ABNORMAL LOW (ref 96–112)
Creatinine, Ser: 2.53 mg/dL — ABNORMAL HIGH (ref 0.50–1.35)
GFR calc Af Amer: 25 mL/min — ABNORMAL LOW (ref >90)
GFR calc non Af Amer: 22 mL/min — ABNORMAL LOW (ref >90)
GFR, EST AFRICAN AMERICAN: 35 mL/min — AB (ref >90)
GFR, EST NON AFRICAN AMERICAN: 30 mL/min — AB (ref >90)
GLUCOSE: 154 mg/dL — AB (ref 70–99)
Glucose, Bld: 253 mg/dL — ABNORMAL HIGH (ref 70–99)
POTASSIUM: 3.8 meq/L (ref 3.7–5.3)
POTASSIUM: 5 meq/L (ref 3.7–5.3)
Sodium: 134 mEq/L — ABNORMAL LOW (ref 137–147)
Sodium: 134 mEq/L — ABNORMAL LOW (ref 137–147)
TOTAL PROTEIN: 8.2 g/dL (ref 6.0–8.3)
Total Bilirubin: 0.3 mg/dL (ref 0.3–1.2)
Total Bilirubin: 0.3 mg/dL (ref 0.3–1.2)
Total Protein: 7.1 g/dL (ref 6.0–8.3)

## 2014-03-22 LAB — GLUCOSE, CAPILLARY
GLUCOSE-CAPILLARY: 117 mg/dL — AB (ref 70–99)
GLUCOSE-CAPILLARY: 169 mg/dL — AB (ref 70–99)
GLUCOSE-CAPILLARY: 160 mg/dL — AB (ref 70–99)
Glucose-Capillary: 139 mg/dL — ABNORMAL HIGH (ref 70–99)

## 2014-03-22 LAB — LIPID PANEL
Cholesterol: 318 mg/dL — ABNORMAL HIGH (ref 0–200)
HDL: 21 mg/dL — ABNORMAL LOW (ref >39)
LDL CALC: UNDETERMINED mg/dL (ref 0–99)
Total CHOL/HDL Ratio: 15.1 RATIO
Triglycerides: 727 mg/dL — ABNORMAL HIGH (ref <150)
VLDL: UNDETERMINED mg/dL (ref 0–40)

## 2014-03-22 LAB — RAPID URINE DRUG SCREEN, HOSP PERFORMED
Amphetamines: NOT DETECTED
BARBITURATES: NOT DETECTED
Benzodiazepines: POSITIVE — AB
Cocaine: NOT DETECTED
Opiates: POSITIVE — AB
TETRAHYDROCANNABINOL: NOT DETECTED

## 2014-03-22 LAB — URINE MICROSCOPIC-ADD ON

## 2014-03-22 LAB — CBC
HCT: 39 % (ref 39.0–52.0)
HEMOGLOBIN: 13.1 g/dL (ref 13.0–17.0)
MCH: 31 pg (ref 26.0–34.0)
MCHC: 33.6 g/dL (ref 30.0–36.0)
MCV: 92.2 fL (ref 78.0–100.0)
Platelets: 267 10*3/uL (ref 150–400)
RBC: 4.23 MIL/uL (ref 4.22–5.81)
RDW: 12.5 % (ref 11.5–15.5)
WBC: 10.2 10*3/uL (ref 4.0–10.5)

## 2014-03-22 LAB — PROTIME-INR
INR: 0.95 (ref 0.00–1.49)
PROTHROMBIN TIME: 12.7 s (ref 11.6–15.2)

## 2014-03-22 LAB — HEMOGLOBIN A1C
Hgb A1c MFr Bld: 7.6 % — ABNORMAL HIGH (ref <5.7)
MEAN PLASMA GLUCOSE: 171 mg/dL — AB (ref <117)

## 2014-03-22 LAB — TROPONIN I
Troponin I: 2.21 ng/mL (ref <0.30)
Troponin I: 12.18 ng/mL (ref <0.30)
Troponin I: 14.8 ng/mL (ref <0.30)

## 2014-03-22 LAB — CREATININE, SERUM
Creatinine, Ser: 2.55 mg/dL — ABNORMAL HIGH (ref 0.50–1.35)
GFR calc Af Amer: 35 mL/min — ABNORMAL LOW (ref >90)
GFR, EST NON AFRICAN AMERICAN: 30 mL/min — AB (ref >90)

## 2014-03-22 LAB — ETHANOL

## 2014-03-22 LAB — MRSA PCR SCREENING: MRSA BY PCR: NEGATIVE

## 2014-03-22 SURGERY — LEFT HEART CATHETERIZATION WITH CORONARY ANGIOGRAM
Anesthesia: LOCAL

## 2014-03-22 MED ORDER — FENTANYL CITRATE 0.05 MG/ML IJ SOLN
INTRAMUSCULAR | Status: AC
  Filled 2014-03-22: qty 2

## 2014-03-22 MED ORDER — ATORVASTATIN CALCIUM 80 MG PO TABS
80.0000 mg | ORAL_TABLET | Freq: Every day | ORAL | Status: DC
  Administered 2014-03-22 – 2014-03-23 (×2): 80 mg via ORAL
  Filled 2014-03-22 (×3): qty 1

## 2014-03-22 MED ORDER — ONDANSETRON HCL 4 MG/2ML IJ SOLN: 4.0000 mg | Freq: Four times a day (QID) | INTRAMUSCULAR | Status: DC | PRN

## 2014-03-22 MED ORDER — SODIUM CHLORIDE 0.9 % IV SOLN
1.0000 mL/kg/h | INTRAVENOUS | Status: AC
  Administered 2014-03-22: 1 mL/kg/h via INTRAVENOUS

## 2014-03-22 MED ORDER — ATORVASTATIN CALCIUM 20 MG PO TABS
20.0000 mg | ORAL_TABLET | Freq: Every day | ORAL | Status: DC
  Filled 2014-03-22: qty 1

## 2014-03-22 MED ORDER — BIVALIRUDIN 250 MG IV SOLR
INTRAVENOUS | Status: AC
  Filled 2014-03-22: qty 250

## 2014-03-22 MED ORDER — NITROGLYCERIN 0.4 MG SL SUBL
0.4000 mg | SUBLINGUAL_TABLET | SUBLINGUAL | Status: DC | PRN
  Administered 2014-03-22 (×2): 0.4 mg via SUBLINGUAL
  Filled 2014-03-22: qty 1

## 2014-03-22 MED ORDER — ALPRAZOLAM 0.5 MG PO TABS
0.5000 mg | ORAL_TABLET | Freq: Four times a day (QID) | ORAL | Status: DC | PRN
  Administered 2014-03-22 (×2): 0.5 mg via ORAL
  Filled 2014-03-22 (×3): qty 1

## 2014-03-22 MED ORDER — VERAPAMIL HCL 2.5 MG/ML IV SOLN
INTRAVENOUS | Status: AC
  Filled 2014-03-22: qty 2

## 2014-03-22 MED ORDER — NITROGLYCERIN 0.2 MG/ML ON CALL CATH LAB
INTRAVENOUS | Status: AC
  Filled 2014-03-22: qty 1

## 2014-03-22 MED ORDER — SODIUM CHLORIDE 0.9 % IV SOLN: 1.7500 mg/kg/h | INTRAVENOUS | Status: AC

## 2014-03-22 MED ORDER — SODIUM CHLORIDE 0.9 % IV SOLN
INTRAVENOUS | Status: DC
  Administered 2014-03-22: 02:00:00 via INTRAVENOUS

## 2014-03-22 MED ORDER — LIVING WELL WITH DIABETES BOOK
Freq: Once | Status: DC
  Filled 2014-03-22: qty 1

## 2014-03-22 MED ORDER — ISOSORBIDE MONONITRATE ER 30 MG PO TB24
30.0000 mg | ORAL_TABLET | Freq: Every day | ORAL | Status: DC
  Administered 2014-03-22 – 2014-03-24 (×3): 30 mg via ORAL
  Filled 2014-03-22 (×3): qty 1

## 2014-03-22 MED ORDER — ACETAMINOPHEN 325 MG PO TABS: 650.0000 mg | ORAL_TABLET | ORAL | Status: DC | PRN

## 2014-03-22 MED ORDER — HEPARIN (PORCINE) IN NACL 2-0.9 UNIT/ML-% IJ SOLN
INTRAMUSCULAR | Status: AC
  Filled 2014-03-22: qty 1000

## 2014-03-22 MED ORDER — ACETAMINOPHEN 325 MG PO TABS
650.0000 mg | ORAL_TABLET | ORAL | Status: DC | PRN
  Administered 2014-03-22: 650 mg via ORAL
  Filled 2014-03-22: qty 2

## 2014-03-22 MED ORDER — METOPROLOL TARTRATE 25 MG PO TABS
25.0000 mg | ORAL_TABLET | Freq: Two times a day (BID) | ORAL | Status: DC
  Administered 2014-03-22 – 2014-03-24 (×6): 25 mg via ORAL
  Filled 2014-03-22 (×8): qty 1

## 2014-03-22 MED ORDER — ASPIRIN 81 MG PO CHEW
81.0000 mg | CHEWABLE_TABLET | Freq: Every day | ORAL | Status: DC
  Administered 2014-03-22 – 2014-03-24 (×3): 81 mg via ORAL
  Filled 2014-03-22 (×3): qty 1

## 2014-03-22 MED ORDER — SODIUM CHLORIDE 0.9 % IV SOLN: 250.0000 mL | INTRAVENOUS | Status: DC | PRN

## 2014-03-22 MED ORDER — MIDAZOLAM HCL 2 MG/2ML IJ SOLN
INTRAMUSCULAR | Status: AC
  Filled 2014-03-22: qty 2

## 2014-03-22 MED ORDER — INSULIN ASPART 100 UNIT/ML ~~LOC~~ SOLN
0.0000 [IU] | Freq: Three times a day (TID) | SUBCUTANEOUS | Status: DC
  Administered 2014-03-22: 2 [IU] via SUBCUTANEOUS

## 2014-03-22 MED ORDER — SODIUM CHLORIDE 0.9 % IJ SOLN
3.0000 mL | Freq: Two times a day (BID) | INTRAMUSCULAR | Status: DC
  Administered 2014-03-22 – 2014-03-23 (×4): 3 mL via INTRAVENOUS

## 2014-03-22 MED ORDER — LIDOCAINE HCL (PF) 1 % IJ SOLN
INTRAMUSCULAR | Status: AC
  Filled 2014-03-22: qty 30

## 2014-03-22 MED ORDER — FENTANYL CITRATE 0.05 MG/ML IJ SOLN
25.0000 ug | Freq: Once | INTRAMUSCULAR | Status: AC
  Administered 2014-03-22: 25 ug via INTRAVENOUS
  Filled 2014-03-22: qty 2

## 2014-03-22 MED ORDER — MIDAZOLAM HCL 2 MG/2ML IJ SOLN
INTRAMUSCULAR | Status: AC
Start: 2014-03-22 — End: 2014-03-22
  Filled 2014-03-22: qty 2

## 2014-03-22 MED ORDER — PRASUGREL HCL 10 MG PO TABS
ORAL_TABLET | ORAL | Status: AC
  Filled 2014-03-22: qty 6

## 2014-03-22 MED ORDER — PRASUGREL HCL 10 MG PO TABS
10.0000 mg | ORAL_TABLET | Freq: Every day | ORAL | Status: DC
  Administered 2014-03-22 – 2014-03-24 (×3): 10 mg via ORAL
  Filled 2014-03-22 (×4): qty 1

## 2014-03-22 MED ORDER — MIDAZOLAM HCL 2 MG/2ML IJ SOLN
0.5000 mg | Freq: Once | INTRAMUSCULAR | Status: AC
  Administered 2014-03-22: 0.5 mg via INTRAVENOUS
  Filled 2014-03-22: qty 2

## 2014-03-22 MED ORDER — ASPIRIN EC 81 MG PO TBEC: 81.0000 mg | DELAYED_RELEASE_TABLET | Freq: Every day | ORAL | Status: DC

## 2014-03-22 MED ORDER — SODIUM CHLORIDE 0.9 % IJ SOLN: 3.0000 mL | INTRAMUSCULAR | Status: DC | PRN

## 2014-03-22 MED ORDER — INSULIN ASPART 100 UNIT/ML ~~LOC~~ SOLN
0.0000 [IU] | Freq: Three times a day (TID) | SUBCUTANEOUS | Status: DC
Start: 2014-03-22 — End: 2014-03-24
  Administered 2014-03-22 – 2014-03-23 (×2): 3 [IU] via SUBCUTANEOUS
  Administered 2014-03-23: 5 [IU] via SUBCUTANEOUS
  Administered 2014-03-23 – 2014-03-24 (×2): 3 [IU] via SUBCUTANEOUS

## 2014-03-22 NOTE — H&P (Addendum)
History and Physical  Patient ID: Albert Villegas MRN: [id number], SOB: [birth year] 42 y.o. Date of Encounter: 03/22/2014, 1:04 AM  Primary Physician: Albert Clan, MD Primary Cardiologist: none  Chief Complaint: chest pain  HPI: 43 y.o. male w/ PMHx significant for GERD, tobacco abuse pre-diabetes and HTN (not on meds) who presented to [facility] on 03/22/2014 with complaints of chest pain that started at around 45 minutes prior to presentation. Pain radiates down his left arm. Also felt dizzy and nausea/vomiting. No diaphoresis. At [facility] ER, STEMI called.  EKG revealed NSR with 2 mm STE inferiorly with depression in v1, v2. CXR was without acute cardiopulmonary abnormalities. Labs are significant for multiple abnormalities including creatinine 3.2.   Past Medical History  Diagnosis Date  . Tear of medial cartilage or meniscus of knee, current 02/2012    left  . Sprain and strain of cruciate ligament of knee 02/2012    left ACL  . GERD (gastroesophageal reflux disease)   . Depression    HTN Hyperlipidemia  Surgical History:  Past Surgical History  Procedure Laterality Date  . Reduction of torsion of testis  age 79  . Knee ligament reconstruction       Home Meds: Prior to Admission medications   Medication Sig Start Date End Date Taking? Authorizing Provider  desvenlafaxine (PRISTIQ) 50 MG 24 hr tablet Take 50 mg by mouth daily. AM   Yes Historical Provider, MD  omeprazole (PRILOSEC OTC) 20 MG tablet Take 20 mg by mouth every evening. PM   Yes Historical Provider, MD  Crestor (unknown dose) benicar (unknown dose)  Allergies: No Known Allergies  History   Social History  . Marital Status: Married    Spouse Name: N/A    Number of Children: N/A  . Years of Education: N/A   Occupational History  . Not on file.   Social History Main Topics  . Smoking status: Current Every Day Smoker -- 1.00 packs/day for 15 years    Types: Cigarettes  . Smokeless  tobacco: Never Used  . Alcohol Use: Yes     Comment: occasionally  . Drug Use: No  . Sexual Activity: Not on file   Other Topics Concern  . Not on file   Social History Narrative  . No narrative on file     No family history on file.  Review of Systems: General: negative for chills, fever, night sweats or weight changes.  Cardiovascular: see HPI  Dermatological: negative for rash Respiratory: negative for cough or wheezing Urologic: negative for hematuria Abdominal: negative for diarrhea, bright red blood per rectum, melena, or hematemesis Neurologic: negative for visual changes, syncope, or dizziness All other systems reviewed and are otherwise negative except as noted above.  Labs:   Lab Results  Component Value Date   WBC 16.6* 03/21/2014   HGB 15.2 03/21/2014   HCT 43.6 03/21/2014   MCV 91.0 03/21/2014   PLT 374 03/21/2014    Recent Labs Lab 03/21/14 2340  NA 134*  K 3.8  CL 87*  CO2 25  BUN 26*  CREATININE 3.29*  CALCIUM 11.7*  PROT 8.2  BILITOT 0.3  ALKPHOS 44  ALT 53  AST 99*  GLUCOSE 253*    Radiology/Studies:  Dg Chest Portable 1 View  03/22/2014   CLINICAL DATA:  Severe Mid chest pain for 1 hr.  EXAM: PORTABLE CHEST - 1 VIEW  COMPARISON:  None.  FINDINGS: Shallow inspiration. Normal heart size and pulmonary vascularity. No focal airspace  disease or consolidation in the lungs. No blunting of costophrenic angles. No pneumothorax. Old left rib fractures.  IMPRESSION: No active disease.   Electronically Signed   By: Burman NievesWilliam  Stevens M.D.   On: 03/22/2014 00:00     EKG: sinus, inf STEMI with anterior recip depress  Physical Exam: Blood pressure 123/69, pulse 92, temperature 97.8 F (36.6 C), temperature source Oral, resp. rate 17, height 5\' 9"  (1.753 m), weight 102.059 kg (225 lb), SpO2 100.00%. General: Well developed, well nourished, in no acute distress. Head: Normocephalic, atraumatic, sclera non-icteric, nares are without discharge Neck: Supple.  Negative for carotid bruits. JVD not elevated. Lungs: Clear bilaterally to auscultation without wheezes, rales, or rhonchi. Breathing is unlabored. Heart: RRR with S1 S2. No murmurs, rubs, or gallops appreciated. Abdomen: Soft, non-tender, non-distended with normoactive bowel sounds. No rebound/guarding. No obvious abdominal masses. Msk:  Strength and tone appear normal for age. Normal allens Extremities: No edema. No clubbing or cyanosis. Distal pedal pulses are 2+ and equal bilaterally. Neuro: Alert and oriented X 3. Moves all extremities spontaneously. Psych:  Responds to questions appropriately with a normal affect.    ASSESSMENT AND PLAN:  Problem List 1. Inferior STEMI, RCA thrombus 2. Tobacco abuse 3. Renal failure, presumably acute 4. Hypercalcemia 5. Hyperglycemia, likely frank diabetes 6. Elevated AST (statin, ETOH, MI) 7. Binge ETOH  43 y.o. male w/ PMHx significant for GERD, tobacco abuse pre-diabetes and HTN (not on meds) who presented [facility] 03[facility]22/2014 with complaints of chest pain that started at around 45 minutes prior to presentation --> inferior STEMI with RCA thrombus now s/p PCI.   Continue aspirin, brilinta, low dose statin (2ndary to LFT abnormalities), beta blocker. No ACEI due to renal dysfunction. Echo to evaluate EF.  Strongly advocated smoking cessation. Binge ETOH use (6 in one sitting), low risk for withdrawal.  Elevated creatinine is unexpected and unfortunate given the dye load. Potentially dehydration combined with ARB (benicar). Renal workup to include renal ultrasound, U/A to start. Hydrate with IV fluids (LVEDP okay). High risk for contrast nephropathy.  Appears to have frank diabetes based upon gluose of 200+. HgA1c and sliding scale insulin to cover.  If hypercalcemia persists, check ionized Ca++ and PTH.  Hold Pristiq (has cardiac warnings).  Prophylaxis: PPI   Signed, Mykaila Blunck C. MD 03/22/2014, 1:04 AM

## 2014-03-22 NOTE — Progress Notes (Signed)
Echo Lab  2D Echocardiogram completed.  Kelvon Giannini L Andrue Dini, RDCS 03/22/2014 9:21 AM

## 2014-03-22 NOTE — Progress Notes (Addendum)
Inpatient Diabetes Program Recommendations  AACE/ADA: New Consensus Statement on Inpatient Glycemic Control (2013)  Target Ranges:  Prepandial:   less than 140 mg/dL      Peak postprandial:   less than 180 mg/dL (1-2 hours)      Critically ill patients:  140 - 180 mg/dL     Results for Albert Villegas, Albert Villegas (MRN [id number]) as of 03/22/2014 21:48  Ref. Range 03/22/2014 07:52 03/22/2014 11:50 03/22/2014 15:21 03/22/2014 21:45  Glucose-Capillary Latest Range: 70-99 mg/dL 962 (H) 229 (H) 798 (H) 160 (H)    Results for Albert Villegas, Albert Villegas (MRN [id number]) as of 03/22/2014 21:48  Ref. Range 03/22/2014 03:40  Hemoglobin A1C Latest Range: <5.7 % 7.6 (H)    Admitted with STEMI.  History of tobacco abuse, HTN, Pre-DM.  PCP- Dr. Clelia Croft with [facility]   Given A1c of 7.6%, patient likely has DM.  Per ADA guidelines, A1c of 6.5% or greater is positive diagnosis of DM.  Will order educational materials and will have bedside RN begin basic DM survival skills education with patient.  Patient needs close follow up with his PCP after d/c.  If his creatinine improves by d/c, may want to consider sending patient home on Metformin 500 mg bid with follow-up with PCP for further DM management given all of patient's major risk factors.  Have ordered Living Well with DM educational booklet as well as RD consult for DM diet education.  Will place order for referral to the [facility] Nutrition and DM Management center for further DM education after d/c.   MD- Please give patient a Rx for CBG meter at time of d/c.  Use order # [id number] for CBG meter and supplies.  Recommend patient check his CBGs bid at home to start (fasting and vary the 2nd check of the day) so he has some blood sugar data to take to his PCP at his follow-up appointment.   Will follow while inpatient. Ambrose Finland RN, MSN, CDE Diabetes Coordinator Inpatient Diabetes Program Team Pager: [phone] (8a-10p)

## 2014-03-22 NOTE — Interval H&P Note (Signed)
History and Physical Interval Note:  03/22/2014 5:23 AM  Albert Villegas  has presented today for surgery, with the diagnosis of STEMI  The various methods of treatment have been discussed with the patient and family. After consideration of risks, benefits and other options for treatment, the patient has consented to  Procedure(s): LEFT HEART CATHETERIZATION WITH CORONARY ANGIOGRAM (N/A) as a surgical intervention .  The patient's history has been reviewed, patient examined, no change in status, stable for surgery.  I have reviewed the patient's chart and labs.  Questions were answered to the patient's satisfaction.    Cath Lab Visit (complete for each Cath Lab visit)  Clinical Evaluation Leading to the Procedure:   ACS: Yes.    Non-ACS:    Anginal Classification: CCS IV  Anti-ischemic medical therapy: No Therapy  Non-Invasive Test Results: No non-invasive testing performed  Prior CABG: No previous CABG       Tonny Bollman

## 2014-03-22 NOTE — Progress Notes (Signed)
CARDIAC REHAB PHASE I   PRE:  Rate/Rhythm: 80 SR  BP:  Supine: 157/44  Sitting:   Standing:    SaO2: 100% RA  MODE:  Ambulation: 300 ft   POST:  Rate/Rhythm: 99  BP:  Supine: 176/54  Sitting:   Standing:    SaO2: 99% RA  1014-1115 Pt tolerated ambulation well with assist x1, no c/o, denies SOB, VSS. To bed after ambulation. MI/PCI education completed with patient and patient's wife including restrictions, risk factor modification, CP, NTG use, and calling 911, heart healthy diet and diabetes diet sheet given, discussed tobacco cessation and pt is receptive. Discussed ambulation and activity progression. Stent card given. Discussed Phase 2 cardiac rehab and patient is interested, permission given to send contact information to the [facility] cardiac rehab program.  Artist Pais, MS, ACSM CCEP

## 2014-03-22 NOTE — CV Procedure (Addendum)
Cardiac Catheterization Procedure Note  Name: Hung [facility] MRN: [id number] DOB: 1971-03-02  Procedure: Left Heart Cath, Selective Coronary Angiography, aspiration thrombectomy and stenting of the RCA  Indication: Inferior STEMI. This is a 43 year old gentleman with diabetes, hypertension, alcohol and tobacco abuse, who presented with severe chest pain to [facility] long hospital. He was diagnosed with a non-ST elevation MI transferred emergently for cardiac catheterization. We noted that he has significant kidney disease from his baseline labs drawn in the emergency department with a creatinine of 3.2. Efforts were made to conserve contrast.  Procedural Details:  The right wrist was prepped, draped, and anesthetized with 1% lidocaine. Using the modified Seldinger technique, a 5/6 French sheath was introduced into the right radial artery. 3 mg of verapamil was administered through the sheath, weight-based unfractionated heparin was administered intravenously. The JR 4 catheter was introduced to image the right coronary artery. The catheter was very tight coming back out of the body. I try to change out her a JL 3.5 cm catheters but that catheter would not pass through the forearm. An angiogram of the radial artery was performed and there appeared to be an accessory radial artery that the sheath was. I was unable to ever pass a wire into the main larger branch and also unable to advance the catheter beyond the forearm. At that point I abandoned radial artery access from the right side, and access the left side. Had to make a fairly prolonged attempt to gain access the left radial artery, but ultimately this was successful. Verapamil was administered through the sheath. A JL 3.5 diagnostic catheter was used to image the left coronary artery. A JR 4 guide catheter was introduced.   PROCEDURAL FINDINGS Hemodynamics: AO 101/76 LV 102/6   Coronary angiography: Coronary dominance: right  Left  mainstem: The left main is widely patent. Divides into the LAD and left circumflex.  Left anterior descending (LAD): The LAD courses to the left ventricular apex. The vessel has minor irregularity but no stenoses. The diagonal branches are small without significant stenoses.  Left circumflex (LCx): The left circumflex is patent. There is a large OM branch no significant stenosis. There is no high-grade disease throughout the left circumflex, but there is mild irregular plaque in the proximal vessel with about 30% stenosis in  Right coronary artery (RCA): The RCA is widely patent in the proximal and mid vessel. At the junction of the mid and distal vessel, there is a oval hypodensity suggestive of plaque rupture and thrombus. The PDA is patent. The posterior AV segment is patent and PLA has a branch that is 100% occluded. There was TIMI-3 flow throughout the remainder of the vessel.  Left ventriculography: Deferred because of kidney disease  PCI Note:  Following the diagnostic procedure, the decision was made to proceed with PCI of the RCA. Effient 60 mg was administered. Weight-based bivalirudin was given for anticoagulation. Once a therapeutic ACT was achieved, a 6 [place] JR 4 guide catheter was inserted.  A cougar coronary guidewire was used to cross the lesion.  Aspiration thrombectomy was performed.  The lesion was then stented with a 3.5 x 12 mm Promus drug-eluting stent.  The stent was postdilated with a 4.0 mm noncompliant balloon.  Following PCI, there was 0% residual stenosis and TIMI-3 flow. Final angiography confirmed an excellent result. The patient tolerated the procedure well. There were no immediate procedural complications. A TR band was used for radial hemostasis. The patient was transferred to the post  catheterization recovery area for further monitoring.  PCI Data: Vessel - RCA/Segment - distal Percent Stenosis (pre)  70 TIMI-flow 3 Stent 3.5x12 mm Promus DES Percent Stenosis  (post) 0 TIMI-flow (post) 3  Final Conclusions:   1. 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 Minor nonobstructive disease involving the LAD and left circumflex   Recommendations:  Will continue Angiomax x2 hours. He should continue on aspirin and effient for one year. He will need aggressive lifestyle modification and risk reduction measures. Will check an echocardiogram to assess LV function. He will undergo evaluation for the cause of his renal dysfunction.  There was significant non-system delay for PCI because of difficulty with vascular access, and need to switch from right radial to left radial access.  Tonny BollmanMichael Eusevio Schriver 03/22/2014, 1:44 AM

## 2014-03-22 NOTE — Progress Notes (Signed)
DAILY PROGRESS NOTE  Subjective:  Has minimal residual chest pain and left arm pain today 2/10, down from 10+/10. There is improving ST elevation inferiorly on the EKG today with only 1 mm elevation (was 3-4 mm). Initial troponin is elevated at 2.21. I suspect this is from distal embolization. BP is somewhat elevated. Cholesterol profile as expected is markedly abnormal (TC 318, TG 727, HDL 21) with very high triglycerides. Glucose was 253 on admission and 154 this am, he was previously (pre-diabetic) - A1c is pending. Renal ultrasound is generally unremarkable except for bladder distention.   Objective:  Temp:  [97.8 F (36.6 C)] 97.8 F (36.6 C) (07/10 2357) Pulse Rate:  [70-106] 75 (07/11 0745) Resp:  [11-26] 20 (07/11 0745) BP: (88-175)/(42-83) 149/83 mmHg (07/11 0745) SpO2:  [92 %-100 %] 99 % (07/11 0745) Weight:  [221 lb 5.5 oz (100.4 kg)-225 lb (102.059 kg)] 221 lb 5.5 oz (100.4 kg) (07/11 0223) Weight change:   Intake/Output from previous day: 07/10 0701 - 07/11 0700 In: 504.5 [I.V.:504.5] Out: -   Intake/Output from this shift:    Medications: Current Facility-Administered Medications  Medication Dose Route Frequency Provider Last Rate Last Dose  . 0.9 %  sodium chloride infusion   Intravenous Continuous Johnna Acosta, MD 999 mL/hr at 03/21/14 2351    . 0.9 %  sodium chloride infusion   Intravenous Continuous Cletus Gash, MD      . 0.9 %  sodium chloride infusion  1 mL/kg/hr Intravenous Continuous Sherren Mocha, MD 102.1 mL/hr at 03/22/14 0740 1 mL/kg/hr at 03/22/14 0740  . 0.9 %  sodium chloride infusion  250 mL Intravenous PRN Sherren Mocha, MD      . acetaminophen (TYLENOL) tablet 650 mg  650 mg Oral Q4H PRN Cletus Gash, MD   650 mg at 03/22/14 0851  . aspirin chewable tablet 81 mg  81 mg Oral Daily Sherren Mocha, MD      . atorvastatin (LIPITOR) tablet 20 mg  20 mg Oral q1800 Cletus Gash, MD      . insulin aspart (novoLOG) injection 0-15 Units   0-15 Units Subcutaneous TID WC Cletus Gash, MD   2 Units at 03/22/14 [phone]  . metoprolol (LOPRESSOR) 1 MG/ML injection           . metoprolol tartrate (LOPRESSOR) tablet 25 mg  25 mg Oral BID Cletus Gash, MD   25 mg at 03/22/14 0739  . nitroGLYCERIN (NITROSTAT) SL tablet 0.4 mg  0.4 mg Sublingual Q5 Min x 3 PRN Cletus Gash, MD   0.4 mg at 03/22/14 0749  . ondansetron (ZOFRAN) injection 4 mg  4 mg Intravenous Q6H PRN Cletus Gash, MD      . prasugrel (EFFIENT) tablet 10 mg  10 mg Oral Daily Sherren Mocha, MD      . sodium chloride 0.9 % injection 3 mL  3 mL Intravenous Q12H Sherren Mocha, MD      . sodium chloride 0.9 % injection 3 mL  3 mL Intravenous PRN Sherren Mocha, MD        Physical Exam: General appearance: alert and no distress Neck: no carotid bruit and no JVD Lungs: clear to auscultation bilaterally Heart: regular rate and rhythm, S1, S2 normal, no murmur, click, rub or gallop Abdomen: soft, non-tender; bowel sounds normal; no masses,  no organomegaly Extremities: extremities normal, atraumatic, no cyanosis or edema Pulses: 2+ and symmetric Skin: warm, mildly diaphoretic Neurologic: Grossly normal Psych: Normal  Lab Results: Results for orders  placed during the hospital encounter of 03/21/14 (from the past 48 hour(s))  APTT     Status: None   Collection Time    03/21/14 11:40 PM      Result Value Ref Range   aPTT 27  24 - 37 seconds  CBC     Status: Abnormal   Collection Time    03/21/14 11:40 PM      Result Value Ref Range   WBC 16.6 (*) 4.0 - 10.5 K/uL   RBC 4.79  4.22 - 5.81 MIL/uL   Hemoglobin 15.2  13.0 - 17.0 g/dL   HCT 43.6  39.0 - 52.0 %   MCV 91.0  78.0 - 100.0 fL   MCH 31.7  26.0 - 34.0 pg   MCHC 34.9  30.0 - 36.0 g/dL   RDW 12.3  11.5 - 15.5 %   Platelets 374  150 - 400 K/uL  COMPREHENSIVE METABOLIC PANEL     Status: Abnormal   Collection Time    03/21/14 11:40 PM      Result Value Ref Range   Sodium 134 (*) 137 - 147 mEq/L    Potassium 3.8  3.7 - 5.3 mEq/L   Chloride 87 (*) 96 - 112 mEq/L   CO2 25  19 - 32 mEq/L   Glucose, Bld 253 (*) 70 - 99 mg/dL   BUN 26 (*) 6 - 23 mg/dL   Creatinine, Ser 3.29 (*) 0.50 - 1.35 mg/dL   Calcium 11.7 (*) 8.4 - 10.5 mg/dL   Total Protein 8.2  6.0 - 8.3 g/dL   Albumin 4.9  3.5 - 5.2 g/dL   AST 99 (*) 0 - 37 U/L   ALT 53  0 - 53 U/L   Alkaline Phosphatase 44  39 - 117 U/L   Total Bilirubin 0.3  0.3 - 1.2 mg/dL   GFR calc non Af Amer 22 (*) >90 mL/min   GFR calc Af Amer 25 (*) >90 mL/min   Comment: (NOTE)     The eGFR has been calculated using the CKD EPI equation.     This calculation has not been validated in all clinical situations.     eGFR's persistently <90 mL/min signify possible Chronic Kidney     Disease.   Anion gap 22 (*) 5 - 15   Comment: RESULT CHECKED  PROTIME-INR     Status: None   Collection Time    03/21/14 11:40 PM      Result Value Ref Range   Prothrombin Time 12.7  11.6 - 15.2 seconds   INR 0.95  0.00 - 1.49  I-STAT TROPOININ, ED     Status: None   Collection Time    03/21/14 11:45 PM      Result Value Ref Range   Troponin i, poc 0.00  0.00 - 0.08 ng/mL   Comment 3            Comment: Due to the release kinetics of cTnI,     a negative result within the first hours     of the onset of symptoms does not rule out     myocardial infarction with certainty.     If myocardial infarction is still suspected,     repeat the test at appropriate intervals.  MRSA PCR SCREENING     Status: None   Collection Time    03/22/14  2:16 AM      Result Value Ref Range   MRSA by PCR NEGATIVE  NEGATIVE  Comment:            The GeneXpert MRSA Assay (FDA     approved for NASAL specimens     only), is one component of a     comprehensive MRSA colonization     surveillance program. It is not     intended to diagnose MRSA     infection nor to guide or     monitor treatment for     MRSA infections.  ETHANOL     Status: None   Collection Time    03/22/14  3:40  AM      Result Value Ref Range   Alcohol, Ethyl (B) <11  0 - 11 mg/dL   Comment:            LOWEST DETECTABLE LIMIT FOR     SERUM ALCOHOL IS 11 mg/dL     FOR MEDICAL PURPOSES ONLY  TROPONIN I     Status: Abnormal   Collection Time    03/22/14  3:40 AM      Result Value Ref Range   Troponin I 2.21 (*) <0.30 ng/mL   Comment:            Due to the release kinetics of cTnI,     a negative result within the first hours     of the onset of symptoms does not rule out     myocardial infarction with certainty.     If myocardial infarction is still suspected,     repeat the test at appropriate intervals.     CRITICAL RESULT CALLED TO, READ BACK BY AND VERIFIED WITH:     PADGETT H,RN 03/22/14 0424 WAYK  COMPREHENSIVE METABOLIC PANEL     Status: Abnormal   Collection Time    03/22/14  3:40 AM      Result Value Ref Range   Sodium 134 (*) 137 - 147 mEq/L   Potassium 5.0  3.7 - 5.3 mEq/L   Chloride 93 (*) 96 - 112 mEq/L   CO2 23  19 - 32 mEq/L   Glucose, Bld 154 (*) 70 - 99 mg/dL   BUN 24 (*) 6 - 23 mg/dL   Creatinine, Ser 2.53 (*) 0.50 - 1.35 mg/dL   Calcium 10.4  8.4 - 10.5 mg/dL   Total Protein 7.1  6.0 - 8.3 g/dL   Albumin 4.1  3.5 - 5.2 g/dL   AST 117 (*) 0 - 37 U/L   ALT 52  0 - 53 U/L   Alkaline Phosphatase 38 (*) 39 - 117 U/L   Total Bilirubin 0.3  0.3 - 1.2 mg/dL   GFR calc non Af Amer 30 (*) >90 mL/min   GFR calc Af Amer 35 (*) >90 mL/min   Comment: (NOTE)     The eGFR has been calculated using the CKD EPI equation.     This calculation has not been validated in all clinical situations.     eGFR's persistently <90 mL/min signify possible Chronic Kidney     Disease.   Anion gap 18 (*) 5 - 15  LIPID PANEL     Status: Abnormal   Collection Time    03/22/14  3:40 AM      Result Value Ref Range   Cholesterol 318 (*) 0 - 200 mg/dL   Triglycerides 727 (*) <150 mg/dL   HDL 21 (*) >39 mg/dL   Total CHOL/HDL Ratio 15.1     VLDL UNABLE TO CALCULATE IF TRIGLYCERIDE OVER 400  mg/dL  0 - 40 mg/dL   LDL Cholesterol UNABLE TO CALCULATE IF TRIGLYCERIDE OVER 400 mg/dL  0 - 99 mg/dL   Comment:            Total Cholesterol/HDL:CHD Risk     Coronary Heart Disease Risk Table                         Men   Women      1/2 Average Risk   3.4   3.3      Average Risk       5.0   4.4      2 X Average Risk   9.6   7.1      3 X Average Risk  23.4   11.0                Use the calculated Patient Ratio     above and the CHD Risk Table     to determine the patient's CHD Risk.                ATP III CLASSIFICATION (LDL):      <100     mg/dL   Optimal      100-129  mg/dL   Near or Above                        Optimal      130-159  mg/dL   Borderline      160-189  mg/dL   High      >190     mg/dL   Very High  CBC     Status: None   Collection Time    03/22/14  3:40 AM      Result Value Ref Range   WBC 10.2  4.0 - 10.5 K/uL   RBC 4.23  4.22 - 5.81 MIL/uL   Hemoglobin 13.1  13.0 - 17.0 g/dL   HCT 39.0  39.0 - 52.0 %   MCV 92.2  78.0 - 100.0 fL   MCH 31.0  26.0 - 34.0 pg   MCHC 33.6  30.0 - 36.0 g/dL   RDW 12.5  11.5 - 15.5 %   Platelets 267  150 - 400 K/uL  CREATININE, SERUM     Status: Abnormal   Collection Time    03/22/14  3:40 AM      Result Value Ref Range   Creatinine, Ser 2.55 (*) 0.50 - 1.35 mg/dL   GFR calc non Af Amer 30 (*) >90 mL/min   GFR calc Af Amer 35 (*) >90 mL/min   Comment: (NOTE)     The eGFR has been calculated using the CKD EPI equation.     This calculation has not been validated in all clinical situations.     eGFR's persistently <90 mL/min signify possible Chronic Kidney     Disease.  GLUCOSE, CAPILLARY     Status: Abnormal   Collection Time    03/22/14  7:52 AM      Result Value Ref Range   Glucose-Capillary 139 (*) 70 - 99 mg/dL   Comment 1 Notify RN    URINE RAPID DRUG SCREEN (HOSP PERFORMED)     Status: Abnormal   Collection Time    03/22/14  8:03 AM      Result Value Ref Range   Opiates POSITIVE (*) NONE DETECTED   Cocaine  NONE DETECTED  NONE DETECTED   Benzodiazepines  POSITIVE (*) NONE DETECTED   Amphetamines NONE DETECTED  NONE DETECTED   Tetrahydrocannabinol NONE DETECTED  NONE DETECTED   Barbiturates NONE DETECTED  NONE DETECTED   Comment:            DRUG SCREEN FOR MEDICAL PURPOSES     ONLY.  IF CONFIRMATION IS NEEDED     FOR ANY PURPOSE, NOTIFY LAB     WITHIN 5 DAYS.                LOWEST DETECTABLE LIMITS     FOR URINE DRUG SCREEN     Drug Class       Cutoff (ng/mL)     Amphetamine      1000     Barbiturate      200     Benzodiazepine   409     Tricyclics       811     Opiates          300     Cocaine          300     THC              50  URINALYSIS, ROUTINE W REFLEX MICROSCOPIC     Status: Abnormal   Collection Time    03/22/14  8:03 AM      Result Value Ref Range   Color, Urine YELLOW  YELLOW   APPearance CLEAR  CLEAR   Specific Gravity, Urine 1.040 (*) 1.005 - 1.030   pH 5.0  5.0 - 8.0   Glucose, UA >1000 (*) NEGATIVE mg/dL   Hgb urine dipstick TRACE (*) NEGATIVE   Bilirubin Urine NEGATIVE  NEGATIVE   Ketones, ur NEGATIVE  NEGATIVE mg/dL   Protein, ur NEGATIVE  NEGATIVE mg/dL   Urobilinogen, UA 0.2  0.0 - 1.0 mg/dL   Nitrite NEGATIVE  NEGATIVE   Leukocytes, UA NEGATIVE  NEGATIVE  URINE MICROSCOPIC-ADD ON     Status: None   Collection Time    03/22/14  8:03 AM      Result Value Ref Range   Squamous Epithelial / LPF RARE  RARE    Imaging: US Renal  03/22/2014   CLINICAL DATA:  Elevated renal function tests  EXAM: RENAL/URINARY TRACT ULTRASOUND COMPLETE  COMPARISON:  None.  FINDINGS: Right Kidney:  Length: 13.8 cm. Echogenicity within normal limits. No mass or hydronephrosis visualized.  Left Kidney:  Length: 12.9 cm. Echogenicity within normal limits. No mass or hydronephrosis visualized.  Bladder:  Appears normal for degree of bladder distention.  Volume is 640 mL  IMPRESSION: Unremarkable renal ultrasound.   Electronically Signed   By: Inez Catalina M.D.   On: 03/22/2014 07:15    Dg Chest Portable 1 View  03/22/2014   CLINICAL DATA:  Severe Mid chest pain for 1 hr.  EXAM: PORTABLE CHEST - 1 VIEW  COMPARISON:  None.  FINDINGS: Shallow inspiration. Normal heart size and pulmonary vascularity. No focal airspace disease or consolidation in the lungs. No blunting of costophrenic angles. No pneumothorax. Old left rib fractures.  IMPRESSION: No active disease.   Electronically Signed   By: Lucienne Capers M.D.   On: 03/22/2014 00:00    Assessment:  Principal Problem:   ST elevation myocardial infarction (STEMI) involving right coronary artery in recovery phase Active Problems:   Dyslipidemia   HTN (hypertension)   Acute renal failure   Alcohol abuse   Plan:  1. Acute inferior STEMI - mild residual chest pain,  continue DAPT. Add imdur 30 mg daily. Follow serial troponins and EKG's. Will review echo results today. 2.   Dyslipidemia - Very abnormal lipid profile. Would recommend switching to high dose lipitor 80 mg. 3.   Acute renal failure - suspect due to dehydration, possibly due to etoh abuse. Renal ultrasound is normal. Agree       With hydration today, especially post cath. 4.   Hyperglycemia - probably diagnostic for diabetes. Awaiting A1c. Start SSI regimen. Would hold off on oral agent to       see if renal function improves.  Diabetes educator consult. 5.   HTN - lopressor started today at 25 mg BID. Hold on ACE-I or ARB due to renal failure. May need to add low dose       amlodipine tomorrow if BP remains elevated. 6.   Etoh abuse - discussed need to cut back on etoh use. Etoh level was low, therefore withdrawal is unlikely. Urine       tox screen showed no drugs of abuse, +opiates and benzos, which were given during cath.  Add prn xanax for       anxiety.  Ok to ambulate with cardiac rehab today. Anticipate d/c in 1-2 days.  Time Spent Directly with Patient:  30 minutes  Length of Stay:  LOS: 1 day   Pixie Casino, MD, [facility] Attending  Cardiologist [facility]  HILTY,Kenneth C 03/22/2014, 8:57 AM

## 2014-03-23 ENCOUNTER — Encounter (HOSPITAL_COMMUNITY): Payer: Self-pay | Admitting: Internal Medicine

## 2014-03-23 DIAGNOSIS — E1165 Type 2 diabetes mellitus with hyperglycemia: Secondary | ICD-10-CM

## 2014-03-23 DIAGNOSIS — IMO0002 Reserved for concepts with insufficient information to code with codable children: Secondary | ICD-10-CM | POA: Diagnosis present

## 2014-03-23 DIAGNOSIS — I158 Other secondary hypertension: Secondary | ICD-10-CM

## 2014-03-23 DIAGNOSIS — E1129 Type 2 diabetes mellitus with other diabetic kidney complication: Secondary | ICD-10-CM

## 2014-03-23 DIAGNOSIS — F319 Bipolar disorder, unspecified: Secondary | ICD-10-CM | POA: Diagnosis present

## 2014-03-23 LAB — GLUCOSE, CAPILLARY
GLUCOSE-CAPILLARY: 204 mg/dL — AB (ref 70–99)
Glucose-Capillary: 151 mg/dL — ABNORMAL HIGH (ref 70–99)
Glucose-Capillary: 166 mg/dL — ABNORMAL HIGH (ref 70–99)
Glucose-Capillary: 174 mg/dL — ABNORMAL HIGH (ref 70–99)

## 2014-03-23 MED ORDER — MODAFINIL 100 MG PO TABS: 200.0000 mg | ORAL_TABLET | Freq: Every day | ORAL | Status: DC

## 2014-03-23 MED ORDER — FENOFIBRATE 160 MG PO TABS
160.0000 mg | ORAL_TABLET | Freq: Every day | ORAL | Status: DC
  Administered 2014-03-23 – 2014-03-24 (×2): 160 mg via ORAL
  Filled 2014-03-23 (×2): qty 1

## 2014-03-23 MED ORDER — EZETIMIBE 10 MG PO TABS
10.0000 mg | ORAL_TABLET | Freq: Every day | ORAL | Status: DC
  Administered 2014-03-23 – 2014-03-24 (×2): 10 mg via ORAL
  Filled 2014-03-23 (×2): qty 1

## 2014-03-23 MED ORDER — ARIPIPRAZOLE 5 MG PO TABS
5.0000 mg | ORAL_TABLET | Freq: Every day | ORAL | Status: DC
  Administered 2014-03-23 – 2014-03-24 (×2): 5 mg via ORAL
  Filled 2014-03-23 (×2): qty 1

## 2014-03-23 MED ORDER — ADULT MULTIVITAMIN W/MINERALS CH
1.0000 | ORAL_TABLET | Freq: Every day | ORAL | Status: DC
  Administered 2014-03-23 – 2014-03-24 (×2): 1 via ORAL
  Filled 2014-03-23 (×2): qty 1

## 2014-03-23 MED ORDER — AMLODIPINE BESYLATE 5 MG PO TABS
5.0000 mg | ORAL_TABLET | Freq: Every day | ORAL | Status: DC
  Administered 2014-03-23 – 2014-03-24 (×2): 5 mg via ORAL
  Filled 2014-03-23 (×2): qty 1

## 2014-03-23 MED ORDER — DESVENLAFAXINE SUCCINATE ER 50 MG PO TB24
50.0000 mg | ORAL_TABLET | Freq: Every day | ORAL | Status: DC
  Administered 2014-03-23 – 2014-03-24 (×2): 50 mg via ORAL
  Filled 2014-03-23 (×3): qty 1

## 2014-03-23 MED ORDER — MODAFINIL 100 MG PO TABS
200.0000 mg | ORAL_TABLET | Freq: Every day | ORAL | Status: DC
  Administered 2014-03-24: 200 mg via ORAL
  Filled 2014-03-23: qty 2

## 2014-03-23 MED ORDER — ARMODAFINIL 250 MG PO TABS
250.0000 mg | ORAL_TABLET | Freq: Every day | ORAL | Status: DC
  Filled 2014-03-23 (×3): qty 1

## 2014-03-23 MED ORDER — MAGNESIUM OXIDE 400 (241.3 MG) MG PO TABS
200.0000 mg | ORAL_TABLET | Freq: Every day | ORAL | Status: DC
  Administered 2014-03-23 – 2014-03-24 (×2): 200 mg via ORAL
  Filled 2014-03-23 (×2): qty 0.5

## 2014-03-23 NOTE — Progress Notes (Signed)
DAILY PROGRESS NOTE  Subjective:  No chest pain. EKG resolving. Echo yesterday shows EF of 45% with global hypokinesis that was worse in the septum and apex. BP remains elevated. Troponin rose to 14.8. Hgb A1C 7.6, therefore definitive diagnosis of Type 2 diabetes. According to his wife, he carries the diagnosis of diabetes. He was on 2 medications at home. We reconciled his medications today - he is on a significant number of medications at home as well as medications for bipolar disorder.  Objective:  Temp:  [97.7 F (36.5 C)-98.8 F (37.1 C)] 97.7 F (36.5 C) (07/12 0800) Pulse Rate:  [62-102] 62 (07/12 0700) Resp:  [9-20] 10 (07/12 0800) BP: (114-171)/(56-102) 154/73 mmHg (07/12 0800) SpO2:  [87 %-100 %] 98 % (07/12 0800) Weight change:   Intake/Output from previous day: 07/11 0701 - 07/12 0700 In: 1380.5 [P.O.:240; I.V.:1140.5] Out: 3270 [Urine:3270]  Intake/Output from this shift:    Medications: Current Facility-Administered Medications  Medication Dose Route Frequency Provider Last Rate Last Dose  . 0.9 %  sodium chloride infusion   Intravenous Continuous Johnna Acosta, MD 999 mL/hr at 03/21/14 2351    . 0.9 %  sodium chloride infusion   Intravenous Continuous Cletus Gash, MD      . 0.9 %  sodium chloride infusion  250 mL Intravenous PRN Sherren Mocha, MD      . acetaminophen (TYLENOL) tablet 650 mg  650 mg Oral Q4H PRN Cletus Gash, MD   650 mg at 03/22/14 0851  . ALPRAZolam Duanne Moron) tablet 0.5 mg  0.5 mg Oral Q6H PRN Pixie Casino, MD   0.5 mg at 03/22/14 2300  . aspirin chewable tablet 81 mg  81 mg Oral Daily Sherren Mocha, MD   81 mg at 03/22/14 1029  . atorvastatin (LIPITOR) tablet 80 mg  80 mg Oral q1800 Pixie Casino, MD   80 mg at 03/22/14 1805  . insulin aspart (novoLOG) injection 0-15 Units  0-15 Units Subcutaneous TID WC Pixie Casino, MD   3 Units at 03/22/14 1643  . isosorbide mononitrate (IMDUR) 24 hr tablet 30 mg  30 mg Oral Daily  Pixie Casino, MD   30 mg at 03/22/14 1029  . living well with diabetes book MISC   Does not apply Once Sherren Mocha, MD      . metoprolol tartrate (LOPRESSOR) tablet 25 mg  25 mg Oral BID Cletus Gash, MD   25 mg at 03/22/14 2119  . nitroGLYCERIN (NITROSTAT) SL tablet 0.4 mg  0.4 mg Sublingual Q5 Min x 3 PRN Cletus Gash, MD   0.4 mg at 03/22/14 0749  . ondansetron (ZOFRAN) injection 4 mg  4 mg Intravenous Q6H PRN Cletus Gash, MD      . prasugrel (EFFIENT) tablet 10 mg  10 mg Oral Daily Sherren Mocha, MD   10 mg at 03/22/14 1025  . sodium chloride 0.9 % injection 3 mL  3 mL Intravenous Q12H Sherren Mocha, MD   3 mL at 03/22/14 2100  . sodium chloride 0.9 % injection 3 mL  3 mL Intravenous PRN Sherren Mocha, MD        Physical Exam: General appearance: alert and no distress Neck: no carotid bruit and no JVD Lungs: clear to auscultation bilaterally Heart: regular rate and rhythm, S1, S2 normal, no murmur, click, rub or gallop Abdomen: soft, non-tender; bowel sounds normal; no masses,  no organomegaly Extremities: extremities normal, atraumatic, no cyanosis or edema Pulses: 2+ and symmetric Skin:  warm, mildly diaphoretic Neurologic: Grossly normal Psych: Normal  Lab Results: Results for orders placed during the hospital encounter of 03/21/14 (from the past 48 hour(s))  APTT     Status: None   Collection Time    03/21/14 11:40 PM      Result Value Ref Range   aPTT 27  24 - 37 seconds  CBC     Status: Abnormal   Collection Time    03/21/14 11:40 PM      Result Value Ref Range   WBC 16.6 (*) 4.0 - 10.5 K/uL   RBC 4.79  4.22 - 5.81 MIL/uL   Hemoglobin 15.2  13.0 - 17.0 g/dL   HCT 43.6  39.0 - 52.0 %   MCV 91.0  78.0 - 100.0 fL   MCH 31.7  26.0 - 34.0 pg   MCHC 34.9  30.0 - 36.0 g/dL   RDW 12.3  11.5 - 15.5 %   Platelets 374  150 - 400 K/uL  COMPREHENSIVE METABOLIC PANEL     Status: Abnormal   Collection Time    03/21/14 11:40 PM      Result Value Ref Range     Sodium 134 (*) 137 - 147 mEq/L   Potassium 3.8  3.7 - 5.3 mEq/L   Chloride 87 (*) 96 - 112 mEq/L   CO2 25  19 - 32 mEq/L   Glucose, Bld 253 (*) 70 - 99 mg/dL   BUN 26 (*) 6 - 23 mg/dL   Creatinine, Ser 3.29 (*) 0.50 - 1.35 mg/dL   Calcium 11.7 (*) 8.4 - 10.5 mg/dL   Total Protein 8.2  6.0 - 8.3 g/dL   Albumin 4.9  3.5 - 5.2 g/dL   AST 99 (*) 0 - 37 U/L   ALT 53  0 - 53 U/L   Alkaline Phosphatase 44  39 - 117 U/L   Total Bilirubin 0.3  0.3 - 1.2 mg/dL   GFR calc non Af Amer 22 (*) >90 mL/min   GFR calc Af Amer 25 (*) >90 mL/min   Comment: (NOTE)     The eGFR has been calculated using the CKD EPI equation.     This calculation has not been validated in all clinical situations.     eGFR's persistently <90 mL/min signify possible Chronic Kidney     Disease.   Anion gap 22 (*) 5 - 15   Comment: RESULT CHECKED  PROTIME-INR     Status: None   Collection Time    03/21/14 11:40 PM      Result Value Ref Range   Prothrombin Time 12.7  11.6 - 15.2 seconds   INR 0.95  0.00 - 1.49  I-STAT TROPOININ, ED     Status: None   Collection Time    03/21/14 11:45 PM      Result Value Ref Range   Troponin i, poc 0.00  0.00 - 0.08 ng/mL   Comment 3            Comment: Due to the release kinetics of cTnI,     a negative result within the first hours     of the onset of symptoms does not rule out     myocardial infarction with certainty.     If myocardial infarction is still suspected,     repeat the test at appropriate intervals.  MRSA PCR SCREENING     Status: None   Collection Time    03/22/14  2:16 AM  Result Value Ref Range   MRSA by PCR NEGATIVE  NEGATIVE   Comment:            The GeneXpert MRSA Assay (FDA     approved for NASAL specimens     only), is one component of a     comprehensive MRSA colonization     surveillance program. It is not     intended to diagnose MRSA     infection nor to guide or     monitor treatment for     MRSA infections.  ETHANOL     Status: None    Collection Time    03/22/14  3:40 AM      Result Value Ref Range   Alcohol, Ethyl (B) <11  0 - 11 mg/dL   Comment:            LOWEST DETECTABLE LIMIT FOR     SERUM ALCOHOL IS 11 mg/dL     FOR MEDICAL PURPOSES ONLY  TROPONIN I     Status: Abnormal   Collection Time    03/22/14  3:40 AM      Result Value Ref Range   Troponin I 2.21 (*) <0.30 ng/mL   Comment:            Due to the release kinetics of cTnI,     a negative result within the first hours     of the onset of symptoms does not rule out     myocardial infarction with certainty.     If myocardial infarction is still suspected,     repeat the test at appropriate intervals.     CRITICAL RESULT CALLED TO, READ BACK BY AND VERIFIED WITH:     PADGETT H,RN 03/22/14 0424 WAYK  COMPREHENSIVE METABOLIC PANEL     Status: Abnormal   Collection Time    03/22/14  3:40 AM      Result Value Ref Range   Sodium 134 (*) 137 - 147 mEq/L   Potassium 5.0  3.7 - 5.3 mEq/L   Chloride 93 (*) 96 - 112 mEq/L   CO2 23  19 - 32 mEq/L   Glucose, Bld 154 (*) 70 - 99 mg/dL   BUN 24 (*) 6 - 23 mg/dL   Creatinine, Ser 2.53 (*) 0.50 - 1.35 mg/dL   Calcium 10.4  8.4 - 10.5 mg/dL   Total Protein 7.1  6.0 - 8.3 g/dL   Albumin 4.1  3.5 - 5.2 g/dL   AST 117 (*) 0 - 37 U/L   ALT 52  0 - 53 U/L   Alkaline Phosphatase 38 (*) 39 - 117 U/L   Total Bilirubin 0.3  0.3 - 1.2 mg/dL   GFR calc non Af Amer 30 (*) >90 mL/min   GFR calc Af Amer 35 (*) >90 mL/min   Comment: (NOTE)     The eGFR has been calculated using the CKD EPI equation.     This calculation has not been validated in all clinical situations.     eGFR's persistently <90 mL/min signify possible Chronic Kidney     Disease.   Anion gap 18 (*) 5 - 15  LIPID PANEL     Status: Abnormal   Collection Time    03/22/14  3:40 AM      Result Value Ref Range   Cholesterol 318 (*) 0 - 200 mg/dL   Triglycerides 727 (*) <150 mg/dL   HDL 21 (*) >39 mg/dL   Total CHOL/HDL Ratio  15.1     VLDL UNABLE TO  CALCULATE IF TRIGLYCERIDE OVER 400 mg/dL  0 - 40 mg/dL   LDL Cholesterol UNABLE TO CALCULATE IF TRIGLYCERIDE OVER 400 mg/dL  0 - 99 mg/dL   Comment:            Total Cholesterol/HDL:CHD Risk     Coronary Heart Disease Risk Table                         Men   Women      1/2 Average Risk   3.4   3.3      Average Risk       5.0   4.4      2 X Average Risk   9.6   7.1      3 X Average Risk  23.4   11.0                Use the calculated Patient Ratio     above and the CHD Risk Table     to determine the patient's CHD Risk.                ATP III CLASSIFICATION (LDL):      <100     mg/dL   Optimal      100-129  mg/dL   Near or Above                        Optimal      130-159  mg/dL   Borderline      160-189  mg/dL   High      >190     mg/dL   Very High  CBC     Status: None   Collection Time    03/22/14  3:40 AM      Result Value Ref Range   WBC 10.2  4.0 - 10.5 K/uL   RBC 4.23  4.22 - 5.81 MIL/uL   Hemoglobin 13.1  13.0 - 17.0 g/dL   HCT 39.0  39.0 - 52.0 %   MCV 92.2  78.0 - 100.0 fL   MCH 31.0  26.0 - 34.0 pg   MCHC 33.6  30.0 - 36.0 g/dL   RDW 12.5  11.5 - 15.5 %   Platelets 267  150 - 400 K/uL  CREATININE, SERUM     Status: Abnormal   Collection Time    03/22/14  3:40 AM      Result Value Ref Range   Creatinine, Ser 2.55 (*) 0.50 - 1.35 mg/dL   GFR calc non Af Amer 30 (*) >90 mL/min   GFR calc Af Amer 35 (*) >90 mL/min   Comment: (NOTE)     The eGFR has been calculated using the CKD EPI equation.     This calculation has not been validated in all clinical situations.     eGFR's persistently <90 mL/min signify possible Chronic Kidney     Disease.  HEMOGLOBIN A1C     Status: Abnormal   Collection Time    03/22/14  3:40 AM      Result Value Ref Range   Hemoglobin A1C 7.6 (*) <5.7 %   Comment: (NOTE)  According to the ADA Clinical Practice Recommendations for 2011, when     HbA1c is used as  a screening test:      >=6.5%   Diagnostic of Diabetes Mellitus               (if abnormal result is confirmed)     5.7-6.4%   Increased risk of developing Diabetes Mellitus     References:Diagnosis and Classification of Diabetes Mellitus,Diabetes     XQJJ,9417,40(CXKGY 1):S62-S69 and Standards of Medical Care in             Diabetes - 2011,Diabetes [id number],3149,70 (Suppl 1):S11-S61.   Mean Plasma Glucose 171 (*) <117 mg/dL   Comment: Performed at [facility]  TROPONIN I     Status: Abnormal   Collection Time    03/22/14  7:48 AM      Result Value Ref Range   Troponin I 12.18 (*) <0.30 ng/mL   Comment:            Due to the release kinetics of cTnI,     a negative result within the first hours     of the onset of symptoms does not rule out     myocardial infarction with certainty.     If myocardial infarction is still suspected,     repeat the test at appropriate intervals.     CRITICAL VALUE NOTED.  VALUE IS CONSISTENT WITH PREVIOUSLY REPORTED AND CALLED VALUE.  GLUCOSE, CAPILLARY     Status: Abnormal   Collection Time    03/22/14  7:52 AM      Result Value Ref Range   Glucose-Capillary 139 (*) 70 - 99 mg/dL   Comment 1 Notify RN    URINE RAPID DRUG SCREEN (HOSP PERFORMED)     Status: Abnormal   Collection Time    03/22/14  8:03 AM      Result Value Ref Range   Opiates POSITIVE (*) NONE DETECTED   Cocaine NONE DETECTED  NONE DETECTED   Benzodiazepines POSITIVE (*) NONE DETECTED   Amphetamines NONE DETECTED  NONE DETECTED   Tetrahydrocannabinol NONE DETECTED  NONE DETECTED   Barbiturates NONE DETECTED  NONE DETECTED   Comment:            DRUG SCREEN FOR MEDICAL PURPOSES     ONLY.  IF CONFIRMATION IS NEEDED     FOR ANY PURPOSE, NOTIFY LAB     WITHIN 5 DAYS.                LOWEST DETECTABLE LIMITS     FOR URINE DRUG SCREEN     Drug Class       Cutoff (ng/mL)     Amphetamine      1000     Barbiturate      200     Benzodiazepine   263     Tricyclics       785      Opiates          300     Cocaine          300     THC              50  URINALYSIS, ROUTINE W REFLEX MICROSCOPIC     Status: Abnormal   Collection Time    03/22/14  8:03 AM      Result Value Ref Range   Color, Urine YELLOW  YELLOW   APPearance CLEAR  CLEAR   Specific  Gravity, Urine 1.040 (*) 1.005 - 1.030   pH 5.0  5.0 - 8.0   Glucose, UA >1000 (*) NEGATIVE mg/dL   Hgb urine dipstick TRACE (*) NEGATIVE   Bilirubin Urine NEGATIVE  NEGATIVE   Ketones, ur NEGATIVE  NEGATIVE mg/dL   Protein, ur NEGATIVE  NEGATIVE mg/dL   Urobilinogen, UA 0.2  0.0 - 1.0 mg/dL   Nitrite NEGATIVE  NEGATIVE   Leukocytes, UA NEGATIVE  NEGATIVE  URINE MICROSCOPIC-ADD ON     Status: None   Collection Time    03/22/14  8:03 AM      Result Value Ref Range   Squamous Epithelial / LPF RARE  RARE  GLUCOSE, CAPILLARY     Status: Abnormal   Collection Time    03/22/14 11:50 AM      Result Value Ref Range   Glucose-Capillary 117 (*) 70 - 99 mg/dL  TROPONIN I     Status: Abnormal   Collection Time    03/22/14 12:54 PM      Result Value Ref Range   Troponin I 14.80 (*) <0.30 ng/mL   Comment:            Due to the release kinetics of cTnI,     a negative result within the first hours     of the onset of symptoms does not rule out     myocardial infarction with certainty.     If myocardial infarction is still suspected,     repeat the test at appropriate intervals.     CRITICAL VALUE NOTED.  VALUE IS CONSISTENT WITH PREVIOUSLY REPORTED AND CALLED VALUE.  GLUCOSE, CAPILLARY     Status: Abnormal   Collection Time    03/22/14  3:21 PM      Result Value Ref Range   Glucose-Capillary 169 (*) 70 - 99 mg/dL  GLUCOSE, CAPILLARY     Status: Abnormal   Collection Time    03/22/14  9:45 PM      Result Value Ref Range   Glucose-Capillary 160 (*) 70 - 99 mg/dL   Comment 1 Documented in Chart     Comment 2 Notify RN    GLUCOSE, CAPILLARY     Status: Abnormal   Collection Time    03/23/14  7:51 AM      Result Value  Ref Range   Glucose-Capillary 151 (*) 70 - 99 mg/dL    Imaging: US Renal  [id number]/[id number]   CLINICAL DATA:  Elevated renal function tests  EXAM: RENAL/URINARY TRACT ULTRASOUND COMPLETE  COMPARISON:  None.  FINDINGS: Right Kidney:  Length: 13.8 cm. Echogenicity within normal limits. No mass or hydronephrosis visualized.  Left Kidney:  Length: 12.9 cm. Echogenicity within normal limits. No mass or hydronephrosis visualized.  Bladder:  Appears normal for degree of bladder distention.  Volume is 640 mL  IMPRESSION: Unremarkable renal ultrasound.   Electronically Signed   By: Alcide Clever M.D.   On: 03/22/2014 07:15   Dg Chest Portable 1 View  03/22/2014   CLINICAL DATA:  Severe Mid chest pain for 1 hr.  EXAM: PORTABLE CHEST - 1 VIEW  COMPARISON:  None.  FINDINGS: Shallow inspiration. Normal heart size and pulmonary vascularity. No focal airspace disease or consolidation in the lungs. No blunting of costophrenic angles. No pneumothorax. Old left rib fractures.  IMPRESSION: No active disease.   Electronically Signed   By: Burman Nieves M.D.   On: 03/22/2014 00:00    Assessment:  Principal Problem:   ST  elevation myocardial infarction (STEMI) involving right coronary artery in recovery phase Active Problems:   Dyslipidemia   HTN (hypertension)   Acute renal failure   Alcohol abuse   Bipolar disorder   Diabetes mellitus with renal manifestations, uncontrolled   Plan:  1.   Acute inferior STEMI - chest pain has resolved. LVEF 45% by echo, inferior Q waves are developing, ST       segments nearly resolved. Continue DAPT. On imdur 30 mg daily.  2.   Dyslipidemia - Very abnormal lipid profile. On high dose lipitor 80 mg. Restart home fenofibrate 160 mg. 3.   Acute renal failure - suspect due to dehydration, possibly due to etoh abuse. Renal ultrasound is normal. Agree       With hydration today, especially post cath. Awaiting BMP this am. 4.   DM2 - A1c of 7.6. Patient met with diabetes educator  yesterday. On SSI regimen. Would hold off on       oral agent to see if renal function improves first.  Likely restart his Xigduo on discharge. 5.   HTN - lopressor started today at 25 mg BID. Hold on ACE-I or ARB due to renal failure. Add low dose amlodipine 5       mg daily. 6.   Etoh abuse - discussed need to cut back on etoh use. Etoh level was low, therefore withdrawal is unlikely. Urine       tox screen showed no drugs of abuse, +opiates and benzos, which were given during cath.  Add prn xanax for       Anxiety. 7.   Bipolar - restart home medications today.  Ok to ambulate with cardiac rehab today. Transfer to telemetry with plan for d/c tomorrow if creatinine has stabilized. Will need to add metformin on discharge and order home diabetes supplies (meter, lancets, etc..).  Follow-up with PCP Dr. Lang Snow.  Time Spent Directly with Patient:  30 minutes  Length of Stay:  LOS: 2 days   Pixie Casino, MD, [facility] Attending Cardiologist [facility]  Angles Trevizo C 03/23/2014, 8:26 AM

## 2014-03-23 NOTE — Progress Notes (Signed)
Utilization Review Completed.Albert Villegas T7/08/2014  

## 2014-03-24 ENCOUNTER — Encounter (HOSPITAL_COMMUNITY): Payer: Self-pay | Admitting: Cardiology

## 2014-03-24 ENCOUNTER — Other Ambulatory Visit: Payer: Self-pay | Admitting: Cardiology

## 2014-03-24 DIAGNOSIS — Z9861 Coronary angioplasty status: Secondary | ICD-10-CM

## 2014-03-24 DIAGNOSIS — Z720 Tobacco use: Secondary | ICD-10-CM

## 2014-03-24 DIAGNOSIS — I2510 Atherosclerotic heart disease of native coronary artery without angina pectoris: Secondary | ICD-10-CM | POA: Diagnosis present

## 2014-03-24 DIAGNOSIS — Z955 Presence of coronary angioplasty implant and graft: Secondary | ICD-10-CM

## 2014-03-24 HISTORY — DX: Tobacco use: Z72.0

## 2014-03-24 LAB — POCT I-STAT, CHEM 8
BUN: 24 mg/dL — AB (ref 6–23)
CALCIUM ION: 1.37 mmol/L — AB (ref 1.12–1.23)
Chloride: 96 mEq/L (ref 96–112)
Creatinine, Ser: 3.2 mg/dL — ABNORMAL HIGH (ref 0.50–1.35)
GLUCOSE: 213 mg/dL — AB (ref 70–99)
HCT: 44 % (ref 39.0–52.0)
Hemoglobin: 15 g/dL (ref 13.0–17.0)
Potassium: 3.6 mEq/L — ABNORMAL LOW (ref 3.7–5.3)
Sodium: 134 mEq/L — ABNORMAL LOW (ref 137–147)
TCO2: 24 mmol/L (ref 0–100)

## 2014-03-24 LAB — BASIC METABOLIC PANEL
Anion gap: 15 (ref 5–15)
BUN: 22 mg/dL (ref 6–23)
CO2: 25 mEq/L (ref 19–32)
CREATININE: 0.99 mg/dL (ref 0.50–1.35)
Calcium: 9.9 mg/dL (ref 8.4–10.5)
Chloride: 100 mEq/L (ref 96–112)
GFR calc non Af Amer: >90 mL/min (ref >90)
Glucose, Bld: 205 mg/dL — ABNORMAL HIGH (ref 70–99)
Potassium: 4.2 mEq/L (ref 3.7–5.3)
Sodium: 140 mEq/L (ref 137–147)

## 2014-03-24 LAB — TROPONIN I: TROPONIN I: 3.88 ng/mL — AB (ref <0.30)

## 2014-03-24 LAB — POCT ACTIVATED CLOTTING TIME
Activated Clotting Time: 270 seconds
Activated Clotting Time: 298 seconds

## 2014-03-24 LAB — GLUCOSE, CAPILLARY
GLUCOSE-CAPILLARY: 153 mg/dL — AB (ref 70–99)
Glucose-Capillary: 170 mg/dL — ABNORMAL HIGH (ref 70–99)

## 2014-03-24 MED ORDER — NITROGLYCERIN 0.4 MG SL SUBL: 0.4000 mg | SUBLINGUAL_TABLET | SUBLINGUAL | Status: AC | PRN

## 2014-03-24 MED ORDER — ACETAMINOPHEN 325 MG PO TABS: 650.0000 mg | ORAL_TABLET | ORAL | Status: DC | PRN

## 2014-03-24 MED ORDER — METOPROLOL TARTRATE 25 MG PO TABS
25.0000 mg | ORAL_TABLET | Freq: Once | ORAL | Status: AC
  Administered 2014-03-24: 25 mg via ORAL
  Filled 2014-03-24: qty 1

## 2014-03-24 MED ORDER — ISOSORBIDE MONONITRATE ER 30 MG PO TB24: 30.0000 mg | ORAL_TABLET | Freq: Every day | ORAL | Status: DC

## 2014-03-24 MED ORDER — PRASUGREL HCL 10 MG PO TABS: 10.0000 mg | ORAL_TABLET | Freq: Every day | ORAL | Status: DC

## 2014-03-24 MED ORDER — ATORVASTATIN CALCIUM 80 MG PO TABS: 80.0000 mg | ORAL_TABLET | Freq: Every day | ORAL | Status: DC

## 2014-03-24 MED ORDER — AMLODIPINE BESYLATE 5 MG PO TABS: 5.0000 mg | ORAL_TABLET | Freq: Every day | ORAL | Status: DC

## 2014-03-24 MED ORDER — METOPROLOL TARTRATE 50 MG PO TABS
50.0000 mg | ORAL_TABLET | Freq: Two times a day (BID) | ORAL | Status: DC
  Filled 2014-03-24: qty 1

## 2014-03-24 MED ORDER — METOPROLOL TARTRATE 50 MG PO TABS: 50.0000 mg | ORAL_TABLET | Freq: Two times a day (BID) | ORAL | Status: DC

## 2014-03-24 MED ORDER — ASPIRIN 81 MG PO CHEW: 81.0000 mg | CHEWABLE_TABLET | Freq: Every day | ORAL | Status: DC

## 2014-03-24 MED FILL — Sodium Chloride IV Soln 0.9%: INTRAVENOUS | Qty: 50 | Status: AC

## 2014-03-24 NOTE — Progress Notes (Signed)
CARDIAC REHAB PHASE I   PRE:  Rate/Rhythm: 95 SR  BP:  Supine: 126/88  Sitting:   Standing:    SaO2:   MODE:  Ambulation: 700 ft   POST:  Rate/Rhythm: 114  BP:  Supine:    Sitting: 130/86  Standing:    SaO2: 99%RA 1008-1058 Pt walked 700 ft with steady gait. Denied  CP. Tolerated well. Heart rate up with activity. Pt received education on SAT. Reviewed stent/effient, NTG use, exercise and carb counting. Pt to contact Dr. Clelia Croft re outpatient diabetic classes. We are referring to [place] Phase 2. Discussed smoking cessation. Pt stated he had nicotine patches already. Wife and pt voiced understanding of ed.    Luetta Nutting, RN BSN  03/24/2014 10:52 AM

## 2014-03-24 NOTE — Discharge Summary (Signed)
Physician Discharge Summary       Patient ID: Albert Villegas MRN: [id number] DOB/AGE: [birth year] 43 y.o.  Admit date: 03/21/2014 Discharge date: 03/24/2014  Discharge Diagnoses:  Principal Problem:   ST elevation myocardial infarction (STEMI) of inferior wall Active Problems:   CAD, multiple vessel   S/P coronary artery stent placement to RCA Promus DES, s/p thrombectomy   Dyslipidemia   HTN (hypertension)   Acute renal failure   Alcohol abuse   Bipolar disorder   Diabetes mellitus with renal manifestations, uncontrolled   Tobacco use   Discharged Condition: good  Primary Cardiologist: Dr. Rennis Golden  Procedures: 03/22/14 emergent cardiac cath by Dr. Excell Seltzer 03/22/14 thrombectomy and DES Promus stent.    Hospital Course: 43 y.o. male w/ PMHx significant for GERD, tobacco abuse, treated diabetes-2 and HTN who presented to [facility] on 03/22/2014 with complaints of chest pain that started at around 45 minutes prior to presentation. Pain radiated down his left arm. Also felt dizzy and nausea/vomiting. No diaphoresis. At [facility] ER, STEMI called.  EKG revealed NSR with 2 mm STE inferiorly with depression in v1, v2. CXR was without acute cardiopulmonary abnormalities. Labs were significant for multiple abnormalities including creatinine 3.2.   Pt underwent emergent cath and thrombectomy and placement of DES Promus stent to RCA.  Renal ultrasound done for acute renal failure was normal. His Cr improved and felt to be due to dehydration and NSAID use.  AT Cr. Normal. ACE on hold for now, will address as outpt.  Lipids, treated.    DM-2 will place back on pre hospital meds -he will follow up with PCP.  [facility]- Dr. Tresa Endo talked to pt and wife -pt was intolerant to face mask, if pt willing will do split sleep study to further eval.  Bipolar disorder stable.  Day of discharge pt seen and examined by Dr. Tresa Endo and found stable for discharge. Smoking cessation was discussed.   Effient 30 day free prescription given.  Pt will follow up in 2 weeks.    Consults: None  Significant Diagnostic Studies:  Troponin pk 14.80, decreasing to 3.88 at discharge. HgB A1C 7.6 U/a with >1000 flucose   BMET    Component Value Date/Time   NA 140 03/24/2014 0925   K 4.2 03/24/2014 0925   CL 100 03/24/2014 0925   CO2 25 03/24/2014 0925   GLUCOSE 205* 03/24/2014 0925   BUN 22 03/24/2014 0925   CREATININE 0.99 03/24/2014 0925   CALCIUM 9.9 03/24/2014 0925   GFRNONAA >90 03/24/2014 0925   GFRAA >90 03/24/2014 0925   CBC    Component Value Date/Time   WBC 10.2 03/22/2014 0340   RBC 4.23 03/22/2014 0340   HGB 13.1 03/22/2014 0340   HCT 39.0 03/22/2014 0340   PLT 267 03/22/2014 0340   MCV 92.2 03/22/2014 0340   MCH 31.0 03/22/2014 0340   MCHC 33.6 03/22/2014 0340   RDW 12.5 03/22/2014 0340   LYMPHSABS 1.4 07/02/2009 1630   MONOABS 0.6 07/02/2009 1630   EOSABS 0.0 07/02/2009 1630   BASOSABS 0.0 07/02/2009 1630    Lipid Panel     Component Value Date/Time   CHOL 318* 03/22/2014 0340   TRIG 727* 03/22/2014 0340   HDL 21* 03/22/2014 0340   CHOLHDL 15.1 03/22/2014 0340   VLDL UNABLE TO CALCULATE IF TRIGLYCERIDE OVER 400 mg/dL 12/19/8117 1478   LDLCALC UNABLE TO CALCULATE IF TRIGLYCERIDE OVER 400 mg/dL 2/95/6213 0865    2D Echo: Left ventricle: Poor image quality  and endocardial definition ? EF 45% diffuse hypokinesis worse in the septum and apex. The cavity size was normal. Wall thickness was increased in a pattern of mild LVH. - Left atrium: The atrium was mildly dilated  EKG at discharge: Normal sinus rhythm  ACUTE MI / STEMI  When compared to last tracing, inferior ST segment elevation appears to be improving  Cardiac Cath: PROCEDURAL FINDINGS  Hemodynamics:  AO 101/76  LV 102/6  Coronary angiography:  Coronary dominance: right  Left mainstem: The left main is widely patent. Divides into the LAD and left circumflex.  Left anterior descending (LAD): The LAD courses to  the left ventricular apex. The vessel has minor irregularity but no stenoses. The diagonal branches are small without significant stenoses.  Left circumflex (LCx): The left circumflex is patent. There is a large OM branch no significant stenosis. There is no high-grade disease throughout the left circumflex, but there is mild irregular plaque in the proximal vessel with about 30% stenosis in  Right coronary artery (RCA): The RCA is widely patent in the proximal and mid vessel. At the junction of the mid and distal vessel, there is a oval hypodensity suggestive of plaque rupture and thrombus. The PDA is patent. The posterior AV segment is patent and PLA has a branch that is 100% occluded. There was TIMI-3 flow throughout the remainder of the vessel.  Left ventriculography: Deferred because of kidney disease  PCI Note: Following the diagnostic procedure, the decision was made to proceed with PCI of the RCA. Effient 60 mg was administered. Weight-based bivalirudin was given for anticoagulation. Once a therapeutic ACT was achieved, a 6 [place] JR 4 guide catheter was inserted. A cougar coronary guidewire was used to cross the lesion. Aspiration thrombectomy was performed. The lesion was then stented with a 3.5 x 12 mm Promus drug-eluting stent. The stent was postdilated with a 4.0 mm noncompliant balloon. Following PCI, there was 0% residual stenosis and TIMI-3 flow. Final angiography confirmed an excellent result. The patient tolerated the procedure well. There were no immediate procedural complications. A TR band was used for radial hemostasis. The patient was transferred to the post catheterization recovery area for further monitoring.  PCI Data:  Vessel - RCA/Segment - distal  Percent Stenosis (pre) 70  TIMI-flow 3  Stent 3.5x12 mm Promus DES  Percent Stenosis (post) 0  TIMI-flow (post) 3  Final Conclusions:  1. A thrombotic stenosis of the mid to distal right coronary artery with successful PCI using  aspiration thrombectomy and a drug-eluting stent  2. Minor nonobstructive disease involving the LAD and left circumflex    Renal ultrasound: FINDINGS:  Right Kidney:  Length: 13.8 cm. Echogenicity within normal limits. No mass or  hydronephrosis visualized.  Left Kidney:  Length: 12.9 cm. Echogenicity within normal limits. No mass or  hydronephrosis visualized.  Bladder:  Appears normal for degree of bladder distention. Volume is 640 mL  IMPRESSION:  Unremarkable renal ultrasound    PORTABLE CHEST - 1 VIEW  COMPARISON: None.  FINDINGS:  Shallow inspiration. Normal heart size and pulmonary vascularity. No  focal airspace disease or consolidation in the lungs. No blunting of  costophrenic angles. No pneumothorax. Old left rib fractures.  IMPRESSION:  No active disease.    Discharge Exam: Blood pressure 140/91, pulse 75, temperature 97.9 F (36.6 C), temperature source Oral, resp. rate 14, height 5\' 9"  (1.753 m), weight 221 lb 5.5 oz (100.4 kg), SpO2 96.00%. Disposition: 01-Home or Self Care  Discharge Instructions   Amb Referral to Cardiac Rehabilitation    Complete by:  As directed      Ambulatory referral to Nutrition and Diabetic Education    Complete by:  As directed   A1c 7.6%.  PCP Dr. Clelia Croft. Admitted for STEMI.  New diagnosis of DM.            Medication List    STOP taking these medications       naproxen 500 MG tablet  Commonly known as:  NAPROSYN     olmesartan-hydrochlorothiazide 40-12.5 MG per tablet  Commonly known as:  BENICAR HCT      TAKE these medications       acetaminophen 325 MG tablet  Commonly known as:  TYLENOL  Take 2 tablets (650 mg total) by mouth every 4 (four) hours as needed for headache or mild pain.     amLODipine 5 MG tablet  Commonly known as:  NORVASC  Take 1 tablet (5 mg total) by mouth daily.     ARIPiprazole 5 MG tablet  Commonly known as:  ABILIFY  Take 5 mg by mouth daily.     aspirin 81 MG chewable tablet    Chew 1 tablet (81 mg total) by mouth daily.     atorvastatin 80 MG tablet  Commonly known as:  LIPITOR  Take 1 tablet (80 mg total) by mouth daily at 6 PM.     desvenlafaxine 50 MG 24 hr tablet  Commonly known as:  PRISTIQ  Take 50 mg by mouth every morning.     ezetimibe 10 MG tablet  Commonly known as:  ZETIA  Take 10 mg by mouth daily.     fenofibrate 160 MG tablet  Take 160 mg by mouth daily.     HYDROcodone-acetaminophen 10-325 MG per tablet  Commonly known as:  NORCO  Take 1 tablet by mouth every 6 (six) hours as needed for moderate pain.     isosorbide mononitrate 30 MG 24 hr tablet  Commonly known as:  IMDUR  Take 1 tablet (30 mg total) by mouth daily.     Magnesium 250 MG Tabs  Take 250 mg by mouth daily.     metoprolol 50 MG tablet  Commonly known as:  LOPRESSOR  Take 1 tablet (50 mg total) by mouth 2 (two) times daily.     multivitamin tablet  Take 1 tablet by mouth daily.     nitroGLYCERIN 0.4 MG SL tablet  Commonly known as:  NITROSTAT  Place 1 tablet (0.4 mg total) under the tongue every 5 (five) minutes x 3 doses as needed for chest pain.     NUVIGIL 250 MG tablet  Generic drug:  Armodafinil  Take 250 mg by mouth daily.     omeprazole 20 MG tablet  Commonly known as:  PRILOSEC OTC  Take 20 mg by mouth every evening.     prasugrel 10 MG Tabs tablet  Commonly known as:  EFFIENT  Take 1 tablet (10 mg total) by mouth daily.     XIGDUO XR 01-999 MG Tb24  Generic drug:  Dapagliflozin-Metformin HCl ER  Take 1 tablet by mouth daily.       Follow-up Information   Follow up with Chrystie Nose, MD On 04/09/2014. (with Dr. Blanchie Dessert PA Boyce Medici, at 8:00 AM)    Specialty:  Cardiology   Contact information:   [address] [place] [address] [phone]       Follow up with Clelia Croft,  Chrissie NoaWilliam, MD. Call today. (to make appt for your diabetes)    Specialty:  Internal Medicine   Contact information:   [address] [place] [id number] [phone]        Discharge Instructions:  Heart Healthy Diabetic diet, no sweets, less white bread, potatoes and rice, more whole wheat bread, brown rice, sweet potatoes.  Stop smoking  Take 1 NTG, under your tongue, while sitting.  If no relief of pain may repeat NTG, one tab every 5 minutes up to 3 tablets total over 15 minutes.  If no relief CALL 911.  If you have dizziness/lightheadness  while taking NTG, stop taking and call 911.        [facility] at [phone] if any bleeding, swelling or drainage at cath site.  May shower, no tub baths for 48 hours for groin sticks.   We will schedule you for split sleep study to eval sleep apnea.  Your kidney function was to normal at discharge.  DO NOT TAKE Xigduo until tomorrow then resume.  Decrease or stop alcohol, it increases your glucose.    No driving for 1 week no  Work for 10 days.   Signed: Leone BrandINGOLD,Kylen Ismael R Nurse Practitioner-Certified [facility]: [facility] 03/24/2014, 1:35 PM  Time spent on discharge : >40 minutes.

## 2014-03-24 NOTE — Progress Notes (Signed)
Inpatient Diabetes Program Recommendations  AACE/ADA: New Consensus Statement on Inpatient Glycemic Control (2013)  Target Ranges:  Prepandial:   less than 140 mg/dL      Peak postprandial:   less than 180 mg/dL (1-2 hours)      Critically ill patients:  140 - 180 mg/dL     Results for NEJI, SEITZ (MRN [id number]) as of 03/24/2014 07:49  Ref. Range 03/23/2014 07:51 03/23/2014 12:02 03/23/2014 17:58 03/23/2014 21:25  Glucose-Capillary Latest Range: 70-99 mg/dL 562 (H) 130 (H) 865 (H) 174 (H)   Results for ANUAR, WESOLOSKI (MRN [id number]) as of 03/24/2014 07:49  Ref. Range 03/22/2014 03:40  Hemoglobin A1C Latest Range: <5.7 % 7.6 (H)     PCP- Dr. Clelia Croft with [facility]   **Note that patient's wife spoke with Dr. Rennis Golden yesterday and relayed that patient does have a history of DM and takes Xigduo once daily at home (Metformin + Dapagliflozin).  A1c shows decent control at home.  **Patient's CBGs well controlled on Novolog Moderate SSI   Will follow Ambrose Finland RN, MSN, CDE Diabetes Coordinator Inpatient Diabetes Program Team Pager: [phone] (8a-10p)

## 2014-03-24 NOTE — Discharge Instructions (Signed)
Heart Healthy Diabetic diet, no sweets, less white bread, potatoes and rice, more whole wheat bread, brown rice, sweet potatoes.  Stop smoking  Take 1 NTG, under your tongue, while sitting.  If no relief of pain may repeat NTG, one tab every 5 minutes up to 3 tablets total over 15 minutes.  If no relief CALL 911.  If you have dizziness/lightheadness  while taking NTG, stop taking and call 911.        Call [facility] at [phone] if any bleeding, swelling or drainage at cath site.  May shower, no tub baths for 48 hours for groin sticks.   We will schedule you for split sleep study to eval sleep apnea.  Your kidney function was to normal at discharge.  DO NOT TAKE Xigduo until tomorrow then resume.  Decrease or stop alcohol, it increases your glucose.     No work for 10 days.  No driving for 1 week.

## 2014-03-24 NOTE — Progress Notes (Signed)
Subjective: No chest pain no SOB  Objective: Vital signs in last 24 hours: Temp:  [97.9 F (36.6 C)-98.6 F (37 C)] 97.9 F (36.6 C) (07/13 0630) Pulse Rate:  [75-99] 75 (07/13 0630) Resp:  [14-18] 14 (07/13 0630) BP: (109-172)/(66-91) 140/91 mmHg (07/13 0630) SpO2:  [96 %-99 %] 96 % (07/13 0630) Weight change:  Last BM Date: 03/23/14 Intake/Output from previous day: 07/12 0701 - 07/13 0700 In: 240 [P.O.:240] Out: 700 [Urine:700] Intake/Output this shift:    PE: General:Pleasant affect, NAD Skin:Warm and dry, brisk capillary refill HEENT:normocephalic, sclera clear, mucus membranes moist Heart:S1S2 RRR without murmur, gallup, rub or click Lungs:clear without rales, rhonchi, or wheezes FGH:WEXH, non tender, + BS, do not palpate liver spleen or masses Ext:no lower ext edema, 2+ pedal pulses, 2+ radial pulses Neuro:alert and oriented, MAE, follows commands, + facial symmetry  Tele:  SR at times Stach to 119   Lab Results:  Recent Labs  03/21/14 2340 03/22/14 0340  WBC 16.6* 10.2  HGB 15.2 13.1  HCT 43.6 39.0  PLT 374 267   BMET  Recent Labs  03/21/14 2340 03/22/14 0340  NA 134* 134*  K 3.8 5.0  CL 87* 93*  CO2 25 23  GLUCOSE 253* 154*  BUN 26* 24*  CREATININE 3.29* 2.53*  2.55*  CALCIUM 11.7* 10.4    Recent Labs  03/22/14 0748 03/22/14 1254  TROPONINI 12.18* 14.80*    Lab Results  Component Value Date   CHOL 318* 03/22/2014   HDL 21* 03/22/2014   LDLCALC UNABLE TO CALCULATE IF TRIGLYCERIDE OVER 400 mg/dL 03/22/2014   TRIG 727* 03/22/2014   CHOLHDL 15.1 03/22/2014   Lab Results  Component Value Date   HGBA1C 7.6* 03/22/2014     No results found for this basename: TSH    Hepatic Function Panel  Recent Labs  03/22/14 0340  PROT 7.1  ALBUMIN 4.1  AST 117*  ALT 52  ALKPHOS 38*  BILITOT 0.3    Recent Labs  03/22/14 0340  CHOL 318*   No results found for this basename: PROTIME,  in the last 72  hours     Studies/Results: Echo: Left ventricle: Poor image quality and endocardial definition ? EF 45% diffuse hypokinesis worse in the septum and apex. The cavity size was normal. Wall thickness was increased in a pattern of mild LVH. - Left atrium: The atrium was mildly dilated.    Medications: I have reviewed the patient's current medications. Scheduled Meds: . amLODipine  5 mg Oral Daily  . ARIPiprazole  5 mg Oral Daily  . aspirin  81 mg Oral Daily  . atorvastatin  80 mg Oral q1800  . desvenlafaxine  50 mg Oral Daily  . ezetimibe  10 mg Oral Daily  . fenofibrate  160 mg Oral Daily  . insulin aspart  0-15 Units Subcutaneous TID WC  . isosorbide mononitrate  30 mg Oral Daily  . living well with diabetes book   Does not apply Once  . magnesium oxide  200 mg Oral Daily  . metoprolol tartrate  25 mg Oral BID  . modafinil  200 mg Oral Daily  . multivitamin with minerals  1 tablet Oral Daily  . prasugrel  10 mg Oral Daily  . sodium chloride  3 mL Intravenous Q12H   Continuous Infusions: . sodium chloride Stopped (03/23/14 1900)   PRN Meds:.sodium chloride, acetaminophen, ALPRAZolam, nitroGLYCERIN, ondansetron (ZOFRAN) IV, sodium chloride  Assessment/Plan: 1. Acute inferior  STEMI - A thrombotic stenosis of the mid to distal right coronary artery with successful PCI using aspiration thrombectomy and a drug-eluting stent-- chest pain has resolved. LVEF 45% by echo, inferior Q waves are developing, ST segments nearly resolved. Continue DAPT. pk toponin 14.80 On imdur 30 mg daily. Rehab has not yet seen. 2. Dyslipidemia - Very abnormal lipid profile. On high dose lipitor 80 mg now not at home. Restarted home fenofibrate 160 mg. TG at 727 on zetia as well 3. Acute renal failure - suspect due to dehydration, possibly due to etoh abuse. Renal ultrasound is normal. Agree With hydration today, especially post cath. Awaiting BMP this am.  4. DM2 - A1c of 7.6. Patient met with diabetes  educator yesterday. On SSI regimen. Would hold off on oral agent to see if renal function improves first. Likely restart his Xigduo on discharge. Will check BMP today 5. HTN - lopressor  25 mg BID. Hold on ACE-I or ARB due to renal failure. Add low dose amlodipine 5 mg daily. BP from 112/73 to 140/91 6. Etoh abuse - discussed need to cut back on etoh use. Etoh level was low, therefore withdrawal is unlikely. Urine  tox screen showed no drugs of abuse, +opiates and benzos, which were given during cath. Add prn xanax for Anxiety.  7. Bipolar - restart home medications today.  8. No work for 1-2 weeks 9.  Tobacco abuse, with plans to stop Plan for discharge if ambulates without problems.   LOS: 3 days   Time spent with pt. :15 minutes. [facility] R  Nurse Practitioner Certified Pager [phone] or after 5pm and on weekends call [phone] 03/24/2014, 9:36 AM    Patient seen and examined. Agree with assessment and plan. Day 3 s/p IMI; significant renal insufficiency. Pt admits to taking naprosyn 500 mg bid for 1 week prior to MI and was not well hydrated coupled with contrast load. He also has OSA currently not treated. He did not tolerate FFM but may tolerate nasal pillows; will need to re-assess particularly with cardiovascular comorbidities. Discussed smoking cessation. Not well beta blocked; hr increased to 114 with walking; will titrate lopressor 50 mg bid. Possible dc later today depending on laboratory which is still pending.   Troy Sine, MD, [facility] 03/24/2014 10:22 AM

## 2014-04-03 ENCOUNTER — Encounter: Payer: PRIVATE HEALTH INSURANCE | Attending: Cardiovascular Disease

## 2014-04-03 VITALS — Ht 69.0 in | Wt 219.1 lb

## 2014-04-03 DIAGNOSIS — Z713 Dietary counseling and surveillance: Secondary | ICD-10-CM | POA: Insufficient documentation

## 2014-04-03 DIAGNOSIS — E119 Type 2 diabetes mellitus without complications: Secondary | ICD-10-CM | POA: Insufficient documentation

## 2014-04-06 NOTE — Progress Notes (Signed)
Patient was seen on 04/03/14 for the first of a series of three diabetes self-management courses at [facility]  Patient Education Plan per assessed needs and concerns is to attend four course education program for Diabetes Self Management Education.  Current HbA1c: 7.6%  The following learning objectives were met by the patient during this class:  Describe diabetes  State some common risk factors for diabetes  Defines the role of glucose and insulin  Identifies type of diabetes and pathophysiology  Describe the relationship between diabetes and cardiovascular risk  State the members of the Healthcare Team  States the rationale for glucose monitoring  State when to test glucose  State their individual Target Range  State the importance of logging glucose readings  Describe how to interpret glucose readings  Identifies A1C target  Explain the correlation between A1c and eAG values  State symptoms and treatment of high blood glucose  State symptoms and treatment of low blood glucose  Explain proper technique for glucose testing  Identifies proper sharps disposal  Handouts given during class include:  Living Well with Diabetes book  Carb Counting and Meal Planning book  Meal Plan Card  Carbohydrate guide  Meal planning worksheet  Low Sodium Flavoring Tips  The diabetes portion plate  J6C to eAG Conversion Chart  Diabetes Medications  Diabetes Recommended Care Schedule  Support Group  Diabetes Success Plan  Core Class Satisfaction Survey  Follow-Up Plan:  Attend core 2

## 2014-04-07 ENCOUNTER — Ambulatory Visit (HOSPITAL_COMMUNITY)
Admission: RE | Admit: 2014-04-07 | Discharge: 2014-04-07 | Disposition: A | Discharge status: Home / Self Care | Payer: PRIVATE HEALTH INSURANCE | Source: Ambulatory Visit | Attending: Cardiology | Admitting: Cardiology

## 2014-04-07 ENCOUNTER — Telehealth: Payer: Self-pay | Admitting: Cardiology

## 2014-04-07 DIAGNOSIS — I2119 ST elevation (STEMI) myocardial infarction involving other coronary artery of inferior wall: Secondary | ICD-10-CM | POA: Insufficient documentation

## 2014-04-07 DIAGNOSIS — M79662 Pain in left lower leg: Secondary | ICD-10-CM

## 2014-04-07 DIAGNOSIS — M79605 Pain in left leg: Secondary | ICD-10-CM

## 2014-04-07 DIAGNOSIS — I2510 Atherosclerotic heart disease of native coronary artery without angina pectoris: Secondary | ICD-10-CM | POA: Insufficient documentation

## 2014-04-07 DIAGNOSIS — M79609 Pain in unspecified limb: Secondary | ICD-10-CM | POA: Insufficient documentation

## 2014-04-07 NOTE — Telephone Encounter (Signed)
Returned call to patient he stated he had chest pain this past Saturday night 04/06/14.Stated he took NTG x 3 with relief.Stated he has not had any chest pain since.Stated he is having pain in back of left calf for the last 3 to 4 days.Stated notices pain more when he stands or walks,no swelling,no redness.Stated he was told to call office if he has any chest pain.Will check with our DOD Dr.Harding and call you back.

## 2014-04-07 NOTE — Telephone Encounter (Signed)
Pt had a heart attack on 03-22-14. Saturday he started having chest pains and pain in the calf of his left leg. He was told to notify you if any of this happen,

## 2014-04-07 NOTE — Progress Notes (Signed)
Left Venous Duplex Completed. No evidence of DVT or SVT. Marilynne Halsted, BS, RDMS, RVT

## 2014-04-07 NOTE — Telephone Encounter (Signed)
Returned call to patient spoke to DOD Dr.Harding he advised needs venous dopplers of left lower leg.Advised needs bmet,ck.Left lower venous dopplers scheduled today at 1:30 pm.Advised after dopplers go to [facility] for blood work.

## 2014-04-08 LAB — BASIC METABOLIC PANEL
BUN: 9 mg/dL (ref 6–23)
CO2: 25 meq/L (ref 19–32)
Calcium: 10.6 mg/dL — ABNORMAL HIGH (ref 8.4–10.5)
Chloride: 99 mEq/L (ref 96–112)
Creat: 0.95 mg/dL (ref 0.50–1.35)
Glucose, Bld: 238 mg/dL — ABNORMAL HIGH (ref 70–99)
Potassium: 4.1 mEq/L (ref 3.5–5.3)
SODIUM: 137 meq/L (ref 135–145)

## 2014-04-08 LAB — CK: CK TOTAL: 37 U/L (ref 7–232)

## 2014-04-09 ENCOUNTER — Ambulatory Visit: Payer: PRIVATE HEALTH INSURANCE | Admitting: Cardiology

## 2014-04-09 ENCOUNTER — Telehealth: Payer: Self-pay | Admitting: *Deleted

## 2014-04-09 NOTE — Telephone Encounter (Signed)
Message copied by Tobin Chad on Wed Apr 09, 2014  1:10 PM ------      Message from: Marykay Lex      Created: Tue Apr 08, 2014  1:54 PM       Labs stable - but glucose is higher as is Calcium.      Kidney function looks good.      Marykay Lex, MD       ------

## 2014-04-09 NOTE — Telephone Encounter (Signed)
Spoke to patient. Result given . Verbalized understanding  

## 2014-04-10 ENCOUNTER — Telehealth: Payer: Self-pay | Admitting: *Deleted

## 2014-04-10 ENCOUNTER — Encounter: Payer: Self-pay | Admitting: Cardiology

## 2014-04-10 ENCOUNTER — Ambulatory Visit (INDEPENDENT_AMBULATORY_CARE_PROVIDER_SITE_OTHER): Payer: PRIVATE HEALTH INSURANCE | Admitting: Cardiology

## 2014-04-10 VITALS — BP 118/70 | HR 92 | Ht 69.0 in | Wt 220.3 lb

## 2014-04-10 DIAGNOSIS — Z79899 Other long term (current) drug therapy: Secondary | ICD-10-CM

## 2014-04-10 DIAGNOSIS — I2510 Atherosclerotic heart disease of native coronary artery without angina pectoris: Secondary | ICD-10-CM

## 2014-04-10 LAB — COMPREHENSIVE METABOLIC PANEL
ALT: 13 U/L (ref 0–53)
AST: 24 U/L (ref 0–37)
Albumin: 4.9 g/dL (ref 3.5–5.2)
Alkaline Phosphatase: 32 U/L — ABNORMAL LOW (ref 39–117)
BILIRUBIN TOTAL: 0.4 mg/dL (ref 0.2–1.2)
BUN: 12 mg/dL (ref 6–23)
CHLORIDE: 100 meq/L (ref 96–112)
CO2: 21 meq/L (ref 19–32)
CREATININE: 0.86 mg/dL (ref 0.50–1.35)
Calcium: 10.2 mg/dL (ref 8.4–10.5)
Glucose, Bld: 120 mg/dL — ABNORMAL HIGH (ref 70–99)
Potassium: 5.1 mEq/L (ref 3.5–5.3)
Sodium: 134 mEq/L — ABNORMAL LOW (ref 135–145)
Total Protein: 7.4 g/dL (ref 6.0–8.3)

## 2014-04-10 NOTE — Patient Instructions (Signed)
Brittainy Simmons, PA-C has ordered you to have blood work done TODAY.   Your physician has referred you to Cardiac Rehab, they will contact you.  Your physician recommends that you schedule a follow-up appointment in 2 months with Dr Rennis Golden or sooner if need be. Please contact our office if you have any more chest pain/discomfort.

## 2014-04-10 NOTE — Progress Notes (Signed)
Patient ID: Albert Villegas, male   DOB: 06/21/1971, 43 y.o.   MRN: [id number]    04/10/2014 Sofie HartiganKyle Andrew Molden   06/21/1971  [id number]  Primary Physicia Martha ClanShaw, William, MD Primary Cardiologist: Dr. Rennis GoldenHilty  HPI:  The patient is a 43 year old moderately obese male with a history of GERD, tobacco abuse, treated type 2 diabetes mellitus and hypertension who [facility]2014 with complaints of chest pain started about 45 minutes prior to presentation. He had radiation of pain down his left arm and also noted dizziness, nausea and vomiting. No diaphoresis. EKG demonstrated 2 mm ST elevations inferiorly with depressions in V1 and V2. Code STEMI was activated and he was transported urgently to the cath lab for urgent coronary catheterization. This was performed by Dr. Excell Seltzerooper. He was found to have a complex stenosis of the mid to distal right coronary artery. This was successfully treated with PCI utilizing aspiration thrombectomy and a drug-eluting stent. He was also noted to have minor nonobstructive disease involving the LAD and left circumflex. A 2-D echocardiogram was obtained post cath demonstrating mild systolic dysfunction with an estimated ejection fraction of 45% with diffuse hypokinesis, worse in the septum and apex. He was placed on dual antiplatelet therapy with aspirin and Effient along with metoprolol, Imdur and Lipitor. Zetia and fenofibrate were also added to better treat his hyperlipidemia. Triglycerides were severely elevated at 727 mg/dL. LDL was not calculated due to triglyceride levels over 400. ACE/ARB therapy was not initiated due to borderline hypotension. Of note, his Hgb A1c was also elevated at 7.6. His metformin was restarted 48 hours post-cath. He was discharged home on 03/24/2014.   He presents to clinic today for posthospital followup. He is accompanied by his wife. He states that he has done fairly well since discharge. However, he does report  mild substernal chest discomfort that occurred 4 days ago. The discomfort was slightly similar to the discomfort he felt prior to his MI, however was not as severe. It was slight 2/10 chest discomfort, nonradiating but was relieved after sublingual nitroglycerin. He denies any further recurrence since that time. He states he's been fully compliant with dual antiplatelet therapy. He has not missed any doses of his aspirin or Effient. He has also been compliant with all of his other prescribed medications. On occasion, he has felt a bit dizzy, but denies syncope/near-syncope. Also no dyspnea, orthopnea, PND or lower extremity edema. He does note however left posterior calf pain ongoing for the last 3 days. He called the office and was instructed to come in for lower extremity venous Doppler study. This was negative for DVT. A CK  lab was also ordered, in the setting of high-dose statin therapy, and this was within normal limits. Fortunately, he has quit smoking, with the aid of nicotine gum.    Current Outpatient Prescriptions  Medication Sig Dispense Refill  . acetaminophen (TYLENOL) 325 MG tablet Take 2 tablets (650 mg total) by mouth every 4 (four) hours as needed for headache or mild pain.      Marland Kitchen. amLODipine (NORVASC) 5 MG tablet Take 1 tablet (5 mg total) by mouth daily.  30 tablet  6  . ARIPiprazole (ABILIFY) 5 MG tablet Take 5 mg by mouth daily.      . Armodafinil (NUVIGIL) 250 MG tablet Take 250 mg by mouth daily.      Marland Kitchen. aspirin 81 MG chewable tablet Chew 1 tablet (81 mg total) by mouth daily.      .Marland Kitchen  atorvastatin (LIPITOR) 80 MG tablet Take 1 tablet (80 mg total) by mouth daily at 6 PM.  30 tablet  6  . Dapagliflozin-Metformin HCl ER (XIGDUO XR) 01-999 MG TB24 Take 1 tablet by mouth daily.      Marland Kitchen desvenlafaxine (PRISTIQ) 50 MG 24 hr tablet Take 50 mg by mouth every morning.       . ezetimibe (ZETIA) 10 MG tablet Take 10 mg by mouth daily.      . fenofibrate 160 MG tablet Take 160 mg by mouth  daily.      Marland Kitchen HYDROcodone-acetaminophen (NORCO) 10-325 MG per tablet Take 1 tablet by mouth every 6 (six) hours as needed for moderate pain.      . isosorbide mononitrate (IMDUR) 30 MG 24 hr tablet Take 1 tablet (30 mg total) by mouth daily.  30 tablet  6  . Magnesium 250 MG TABS Take 250 mg by mouth daily.      . metoprolol (LOPRESSOR) 50 MG tablet Take 1 tablet (50 mg total) by mouth 2 (two) times daily.  60 tablet  6  . Multiple Vitamin (MULTIVITAMIN) tablet Take 1 tablet by mouth daily.      . nitroGLYCERIN (NITROSTAT) 0.4 MG SL tablet Place 1 tablet (0.4 mg total) under the tongue every 5 (five) minutes x 3 doses as needed for chest pain.  25 tablet  4  . omeprazole (PRILOSEC OTC) 20 MG tablet Take 20 mg by mouth every evening.       . prasugrel (EFFIENT) 10 MG TABS tablet Take 1 tablet (10 mg total) by mouth daily.  30 tablet  11   No current facility-administered medications for this visit.    No Known Allergies  History   Social History  . Marital Status: Married    Spouse Name: N/A    Number of Children: N/A  . Years of Education: N/A   Occupational History  . Not on file.   Social History Main Topics  . Smoking status: Former Smoker -- 1.00 packs/day for 15 years    Types: Cigarettes    Quit date: 03/22/2014  . Smokeless tobacco: Never Used     Comment: Does use patches, gum, and will possibly use e-cigarrettes.  . Alcohol Use: Yes     Comment: occasionally  . Drug Use: No  . Sexual Activity: Not on file   Other Topics Concern  . Not on file   Social History Narrative  . No narrative on file     Review of Systems: General: negative for chills, fever, night sweats or weight changes.  Cardiovascular: negative for chest pain, dyspnea on exertion, edema, orthopnea, palpitations, paroxysmal nocturnal dyspnea or shortness of breath Dermatological: negative for rash Respiratory: negative for cough or wheezing Urologic: negative for hematuria Abdominal: negative  for nausea, vomiting, diarrhea, bright red blood per rectum, melena, or hematemesis Neurologic: negative for visual changes, syncope, or dizziness All other systems reviewed and are otherwise negative except as noted above.    Blood pressure 118/70, pulse 92, height 5\' 9"  (1.753 m), weight 220 lb 4.8 oz (99.927 kg).  General appearance: alert, cooperative, no distress and moderately obese Neck: no carotid bruit and no JVD Lungs: clear to auscultation bilaterally Heart: regular rate and rhythm, S1, S2 normal, no murmur, click, rub or gallop Extremities: no LEE Pulses: 2+ and symmetric Skin: warm and dry Neurologic: Grossly normal  EKG NSR, 92 bpm  ASSESSMENT AND PLAN:   1. CAD: Status post STEMI with subsequent PCI  plus drug-eluting stenting to the RCA. He had one mild episode of substernal chest pressure immediately relieved with sublingual nitroglycerine, denies any further recurrence. EKG demonstrates normal sinus rhythm. Continue dual antiplatelet therapy with aspirin plus Effient, metoprolol, Imdur and statin.  2. HTN: Controled. Still a bit soft but stable. Will continue to hold initiation of ACE/ARB at this time.   3. HLD: Continue Lipitor, Zetia and fenofibrate. A fasting level will need to be rechecked in 2-3 months. He states his PCP will follow this.  4: DM: Close to goal with Hgb A1c at 7.6 during admission. Followed by PCP.   5. Tobacco Abuse: quit post STEMI. Patient was congratulated on his efforts and was encouraged to continue to refrain from tobacco use.  6. Leg Pain: LE venous doppler is negative for DVT. CK normal.  7. Elevated LFTs: on high dose lipitor, zetia and fenofibrate. F/u CMET to insure levels or not trending up. If so may need to temporarily discontinue statin, Zetia and fenofibrate.  PLAN  He appears to be doing well post discharge after recent treatment for STEMI. Continue current medications as outlined in detail above. We will check a CMET to  reassess LFTs to ensure that levels are stable. Instructed to followup with Dr. Rennis Golden in 4-6 weeks for reassessment.  Verma Grothaus, BRITTAINYPA-C 04/10/2014 2:39 PM

## 2014-04-10 NOTE — Telephone Encounter (Signed)
Faxed orders for cardiac rehab phase 2 

## 2014-04-15 ENCOUNTER — Encounter: Payer: PRIVATE HEALTH INSURANCE | Attending: Cardiovascular Disease

## 2014-04-15 DIAGNOSIS — Z713 Dietary counseling and surveillance: Secondary | ICD-10-CM | POA: Diagnosis not present

## 2014-04-15 DIAGNOSIS — IMO0002 Reserved for concepts with insufficient information to code with codable children: Secondary | ICD-10-CM

## 2014-04-15 DIAGNOSIS — E1129 Type 2 diabetes mellitus with other diabetic kidney complication: Secondary | ICD-10-CM

## 2014-04-15 DIAGNOSIS — E1165 Type 2 diabetes mellitus with hyperglycemia: Secondary | ICD-10-CM

## 2014-04-15 DIAGNOSIS — E119 Type 2 diabetes mellitus without complications: Secondary | ICD-10-CM | POA: Insufficient documentation

## 2014-04-15 NOTE — Progress Notes (Signed)

## 2014-04-17 DIAGNOSIS — E119 Type 2 diabetes mellitus without complications: Secondary | ICD-10-CM | POA: Diagnosis not present

## 2014-04-19 ENCOUNTER — Other Ambulatory Visit (HOSPITAL_COMMUNITY): Payer: Self-pay | Admitting: Cardiology

## 2014-04-21 NOTE — Telephone Encounter (Signed)
Rx refill sent to patient pharmacy   

## 2014-04-21 NOTE — Progress Notes (Deleted)
Patient was seen on 04/17/14 for the third of a series of three diabetes self-management courses at the [facility]. The following learning objectives were met by the patient during this class:    State the amount of activity recommended for healthy living   Describe activities suitable for individual needs   Identify ways to regularly incorporate activity into daily life   Identify barriers to activity and ways to over come these barriers  Identify diabetes medications being personally used and their primary action for lowering glucose and possible side effects   Describe role of stress on blood glucose and develop strategies to address psychosocial issues   Identify diabetes complications and ways to prevent them  Explain how to manage diabetes during illness   Evaluate success in meeting personal goal   Establish 2-3 goals that they will plan to diligently work on until they return for the  81-month follow-up visit  Goals:  Follow Diabetes Meal Plan as instructed  Aim for 15-30 mins of physical activity daily as tolerated  Bring food record and glucose log to your follow up visit  Your patient has established the following 4 month goals in their individualized success plan: I will count my carb choices at most meals and snacks I will increase my activity level at least 4 days a week I will take my diabetes medications as scheduled I will test my glucose at least 2 times a day, 7 days a week To help manage stress I will do meditation at least 5 times a week Be aware of how diabetes effects my life and others  Your patient has identified these potential barriers to change:  Motivation  Your patient has identified their diabetes self-care support plan as  [facility] Support [facility]  Family support  On-Line resources  Plan:  Attend Core 4 in 4 months

## 2014-04-21 NOTE — Progress Notes (Signed)
Patient was seen on 04/17/14 for the third of a series of three diabetes self-management courses at the [facility]. The following learning objectives were met by the patient during this class:    State the amount of activity recommended for healthy living   Describe activities suitable for individual needs   Identify ways to regularly incorporate activity into daily life   Identify barriers to activity and ways to over come these barriers  Identify diabetes medications being personally used and their primary action for lowering glucose and possible side effects   Describe role of stress on blood glucose and develop strategies to address psychosocial issues   Identify diabetes complications and ways to prevent them  Explain how to manage diabetes during illness   Evaluate success in meeting personal goal   Establish 2-3 goals that they will plan to diligently work on until they return for the  52-monthfollow-up visit  Goals:  Follow Diabetes Meal Plan as instructed  Aim for 15-30 mins of physical activity daily as tolerated  Bring food record and glucose log to your follow up visit  Your patient has established the following 4 month goals in their individualized success plan:  None provided  Your patient has identified these potential barriers to change:  None provided  Your patient has identified their diabetes self-care support plan as  [facility]  None provided  Plan:  Attend Core 4 in 4 months

## 2014-06-16 ENCOUNTER — Ambulatory Visit (INDEPENDENT_AMBULATORY_CARE_PROVIDER_SITE_OTHER): Payer: PRIVATE HEALTH INSURANCE | Admitting: Internal Medicine

## 2014-06-16 ENCOUNTER — Encounter: Payer: Self-pay | Admitting: Internal Medicine

## 2014-06-16 VITALS — BP 130/86 | HR 79 | Ht 69.0 in | Wt 206.5 lb

## 2014-06-16 DIAGNOSIS — F319 Bipolar disorder, unspecified: Secondary | ICD-10-CM

## 2014-06-16 DIAGNOSIS — I10 Essential (primary) hypertension: Secondary | ICD-10-CM

## 2014-06-16 DIAGNOSIS — Z23 Encounter for immunization: Secondary | ICD-10-CM

## 2014-06-16 DIAGNOSIS — Z955 Presence of coronary angioplasty implant and graft: Secondary | ICD-10-CM

## 2014-06-16 DIAGNOSIS — Z720 Tobacco use: Secondary | ICD-10-CM

## 2014-06-16 DIAGNOSIS — E785 Hyperlipidemia, unspecified: Secondary | ICD-10-CM

## 2014-06-16 DIAGNOSIS — F1010 Alcohol abuse, uncomplicated: Secondary | ICD-10-CM

## 2014-06-16 DIAGNOSIS — I2510 Atherosclerotic heart disease of native coronary artery without angina pectoris: Secondary | ICD-10-CM

## 2014-06-16 MED ORDER — NICOTINE POLACRILEX 2 MG MT GUM: 2.0000 mg | CHEWING_GUM | OROMUCOSAL | Status: DC | PRN

## 2014-06-16 MED ORDER — NICOTINE 14 MG/24HR TD PT24: 14.0000 mg | MEDICATED_PATCH | Freq: Every day | TRANSDERMAL | Status: DC

## 2014-06-16 MED ORDER — PRASUGREL HCL 10 MG PO TABS: 10.0000 mg | ORAL_TABLET | Freq: Every day | ORAL | Status: DC

## 2014-06-16 NOTE — Progress Notes (Signed)
OFFICE NOTE  Chief Complaint:  Follow-up  Primary Care Physician: Martha Clan, MD  HPI:  Albert Villegas is a 43 year old moderately obese male with a history of GERD, tobacco abuse, treated type 2 diabetes mellitus and hypertension who presented to [facility] on 03/22/2014 with complaints of chest pain started about 45 minutes prior to presentation. He had radiation of pain down his left arm and also noted dizziness, nausea and vomiting. No diaphoresis. EKG demonstrated 2 mm ST elevations inferiorly with depressions in V1 and V2. Code STEMI was activated and he was transported urgently to the cath lab for urgent coronary catheterization. This was performed by Dr. Excell Seltzer. He was found to have a complex stenosis of the mid to distal right coronary artery. This was successfully treated with PCI utilizing aspiration thrombectomy and a drug-eluting stent. He was also noted to have minor nonobstructive disease involving the LAD and left circumflex. A 2-D echocardiogram was obtained post cath demonstrating mild systolic dysfunction with an estimated ejection fraction of 45% with diffuse hypokinesis, worse in the septum and apex. He was placed on dual antiplatelet therapy with aspirin and Effient along with metoprolol, Imdur and Lipitor. Zetia and fenofibrate were also added to better treat his hyperlipidemia. Triglycerides were severely elevated at 727 mg/dL. LDL was not calculated due to triglyceride levels over 400. ACE/ARB therapy was not initiated due to borderline hypotension. Of note, his Hgb A1c was also elevated at 7.6. His metformin was restarted 48 hours post-cath. He was discharged home on 03/24/2014.  He was seen in followup a few weeks ago and was doing fairly well. No adjustments were made to his medications. He's had significant weight loss. In general he's done well with smoking cessation but occasionally he does smoke some cigarettes. He is requesting nicotine replacement  today. He denies any further chest pain. He is now back to work. He is getting some mild bruising with normal activities, probably related to his dual antiplatelet therapy.  PMHx:  Past Medical History  Diagnosis Date  . Tear of medial cartilage or meniscus of knee, current 02/2012    left  . Sprain and strain of cruciate ligament of knee 02/2012    left ACL  . GERD (gastroesophageal reflux disease)   . Depression   . ST elevation myocardial infarction (STEMI) of inferior wall 03/22/14    stent DES to RCA  . Acute renal failure 03/22/14    dehydration resolved  . Hyperlipidemia LDL goal <70   . DM2 (diabetes mellitus, type 2)   . Tobacco use 03/24/2014  . Sleep apnea in adult     Past Surgical History  Procedure Laterality Date  . Reduction of torsion of testis  age 65  . Knee ligament reconstruction    . Coronary angioplasty with stent placement  03/22/14    emergent DES to RCA with thrombectomy    FAMHx:  Family History  Problem Relation Age of Onset  . Hyperlipidemia Other   . Hypertension Other     SOCHx:   reports that he has been smoking Cigarettes.  He has a 15 pack-year smoking history. He has never used smokeless tobacco. He reports that he drinks alcohol. He reports that he does not use illicit drugs.  ALLERGIES:  No Known Allergies  ROS: A comprehensive review of systems was negative except for: Hematologic/lymphatic: positive for easy bruising  HOME MEDS: Current Outpatient Prescriptions  Medication Sig Dispense Refill  . acetaminophen (TYLENOL) 325 MG tablet Take  2 tablets (650 mg total) by mouth every 4 (four) hours as needed for headache or mild pain.      Marland Kitchen. amLODipine (NORVASC) 5 MG tablet Take 1 tablet (5 mg total) by mouth daily.  30 tablet  6  . ARIPiprazole (ABILIFY) 5 MG tablet Take 5 mg by mouth daily.      . Armodafinil (NUVIGIL) 250 MG tablet Take 250 mg by mouth daily.      Marland Kitchen. aspirin 81 MG chewable tablet Chew 1 tablet (81 mg total) by mouth  daily.      Marland Kitchen. atorvastatin (LIPITOR) 80 MG tablet Take 1 tablet (80 mg total) by mouth daily at 6 PM.  30 tablet  6  . Dapagliflozin-Metformin HCl ER (XIGDUO XR) 01-999 MG TB24 Take 1 tablet by mouth daily.      Marland Kitchen. desvenlafaxine (PRISTIQ) 50 MG 24 hr tablet Take 50 mg by mouth every morning.       . ezetimibe (ZETIA) 10 MG tablet Take 10 mg by mouth daily.      . fenofibrate 160 MG tablet Take 160 mg by mouth daily.      . isosorbide mononitrate (IMDUR) 30 MG 24 hr tablet Take 1 tablet (30 mg total) by mouth daily.  30 tablet  6  . Magnesium 250 MG TABS Take 250 mg by mouth daily.      . metoprolol (LOPRESSOR) 50 MG tablet Take 1 tablet (50 mg total) by mouth 2 (two) times daily.  60 tablet  6  . Multiple Vitamin (MULTIVITAMIN) tablet Take 1 tablet by mouth daily.      . nitroGLYCERIN (NITROSTAT) 0.4 MG SL tablet Place 1 tablet (0.4 mg total) under the tongue every 5 (five) minutes x 3 doses as needed for chest pain.  25 tablet  4  . omeprazole (PRILOSEC OTC) 20 MG tablet Take 20 mg by mouth every evening.       . prasugrel (EFFIENT) 10 MG TABS tablet Take 1 tablet (10 mg total) by mouth daily.  35 tablet  0  . nicotine (NICODERM CQ - DOSED IN MG/24 HOURS) 14 mg/24hr patch Place 1 patch (14 mg total) onto the skin daily.  28 patch  0  . nicotine polacrilex (NICORETTE) 2 MG gum Take 1 each (2 mg total) by mouth as needed for smoking cessation.  50 tablet  0   No current facility-administered medications for this visit.    LABS/IMAGING: No results found for this or any previous visit (from the past 48 hour(s)). No results found.  VITALS: BP 130/86  Pulse 79  Ht 5\' 9"  (1.753 m)  Wt 206 lb 8 oz (93.668 kg)  BMI 30.48 kg/m2  EXAM: General appearance: alert and no distress Neck: no carotid bruit and no JVD Lungs: clear to auscultation bilaterally Heart: regular rate and rhythm, S1, S2 normal, no murmur, click, rub or gallop Abdomen: soft, non-tender; bowel sounds normal; no masses,  no  organomegaly Extremities: extremities normal, atraumatic, no cyanosis or edema Pulses: 2+ and symmetric Skin: Skin color, texture, turgor normal. No rashes or lesions Neurologic: Grossly normal Psych: Pleasant, does not appear depressed or manic today  EKG: deferred  ASSESSMENT: 1. CAD status post drug-eluting stent to the right coronary for ST elevation MI in 03/2014 2. Hypertension 3. Dyslipidemia 4. Tobacco abuse 5. Bipolar disorder 6. Type 2 diabetes  PLAN: 1.   Mr. Albert Villegas is doing well now and he continues to improve. He's had weight loss, improved his diet and  work on smoking cessation. He still having some problems with smoking is requesting nicotine replacement today. We will also provide some samples of his medications for cholesterol control. He recently had lab work from his primary care provider and we will request that today. Overall he seems to be doing well enough encouraged exercise and continued activity. We'll plan to see him back in 6 months. He must not have any elective interruption in his dual antiplatelet therapy until July of 2016.  Chrystie Nose, MD, [facility] Attending Cardiologist [facility]  HILTY,Kenneth C 06/16/2014, 2:09 PM

## 2014-06-16 NOTE — Patient Instructions (Signed)
Your physician has recommended you make the following change in your medication..  1. START nicotine patches as directed and use nicorette gum as needed for smoking cessation   Your physician wants you to follow-up in: 6 months with Dr. Rennis Golden. You will receive a reminder letter in the mail two months in advance. If you don't receive a letter, please call our office to schedule the follow-up appointment.

## 2014-08-15 ENCOUNTER — Other Ambulatory Visit: Payer: Self-pay | Admitting: Cardiology

## 2014-08-18 ENCOUNTER — Encounter: Payer: PRIVATE HEALTH INSURANCE | Attending: Internal Medicine

## 2014-08-19 ENCOUNTER — Other Ambulatory Visit: Payer: Self-pay | Admitting: *Deleted

## 2014-08-21 ENCOUNTER — Encounter (HOSPITAL_COMMUNITY): Payer: Self-pay | Admitting: Cardiovascular Disease

## 2014-10-10 ENCOUNTER — Other Ambulatory Visit: Payer: Self-pay | Admitting: Cardiology

## 2014-10-10 NOTE — Telephone Encounter (Signed)
Rx(s) sent to pharmacy electronically.  

## 2014-10-29 ENCOUNTER — Encounter: Payer: Self-pay | Admitting: Internal Medicine

## 2015-03-09 ENCOUNTER — Other Ambulatory Visit: Payer: Self-pay

## 2015-05-08 ENCOUNTER — Other Ambulatory Visit: Payer: Self-pay | Admitting: Internal Medicine

## 2015-05-08 NOTE — Telephone Encounter (Signed)
REFILL 

## 2015-06-27 ENCOUNTER — Other Ambulatory Visit: Payer: Self-pay | Admitting: Internal Medicine

## 2015-07-03 ENCOUNTER — Encounter: Payer: Self-pay | Admitting: Internal Medicine

## 2015-07-03 ENCOUNTER — Ambulatory Visit (INDEPENDENT_AMBULATORY_CARE_PROVIDER_SITE_OTHER): Payer: PRIVATE HEALTH INSURANCE | Admitting: Internal Medicine

## 2015-07-03 VITALS — BP 142/78 | HR 96 | Ht 69.0 in | Wt 239.3 lb

## 2015-07-03 DIAGNOSIS — I10 Essential (primary) hypertension: Secondary | ICD-10-CM | POA: Diagnosis not present

## 2015-07-03 DIAGNOSIS — E785 Hyperlipidemia, unspecified: Secondary | ICD-10-CM

## 2015-07-03 DIAGNOSIS — I2119 ST elevation (STEMI) myocardial infarction involving other coronary artery of inferior wall: Secondary | ICD-10-CM

## 2015-07-03 DIAGNOSIS — Z955 Presence of coronary angioplasty implant and graft: Secondary | ICD-10-CM

## 2015-07-03 DIAGNOSIS — I2510 Atherosclerotic heart disease of native coronary artery without angina pectoris: Secondary | ICD-10-CM

## 2015-07-03 MED ORDER — AMLODIPINE BESYLATE 5 MG PO TABS: ORAL_TABLET | ORAL | Status: DC

## 2015-07-03 MED ORDER — ATORVASTATIN CALCIUM 80 MG PO TABS: ORAL_TABLET | ORAL | Status: DC

## 2015-07-03 MED ORDER — METOPROLOL TARTRATE 50 MG PO TABS: 50.0000 mg | ORAL_TABLET | Freq: Two times a day (BID) | ORAL | Status: DC

## 2015-07-03 NOTE — Patient Instructions (Signed)
Dr Rennis Golden has recommended making the following medication changes: STOP Effient STOP Isosorbide  Dr Rennis Golden recommends that you schedule a follow-up appointment in 1 year. You will receive a reminder letter in the mail two months in advance. If you don't receive a letter, please call our office to schedule the follow-up appointment.

## 2015-07-05 NOTE — Progress Notes (Signed)
OFFICE NOTE  Chief Complaint:  Follow-up  Primary Care Physician: Martha Clan, MD  HPI:  Albert Villegas is a 44 year old moderately obese male with a history of GERD, tobacco abuse, treated type 2 diabetes mellitus and hypertension who presented to [facility] on 03/22/2014 with complaints of chest pain started about 45 minutes prior to presentation. He had radiation of pain down his left arm and also noted dizziness, nausea and vomiting. No diaphoresis. EKG demonstrated 2 mm ST elevations inferiorly with depressions in V1 and V2. Code STEMI was activated and he was transported urgently to the cath lab for urgent coronary catheterization. This was performed by Dr. Excell Seltzer. He was found to have a complex stenosis of the mid to distal right coronary artery. This was successfully treated with PCI utilizing aspiration thrombectomy and a drug-eluting stent. He was also noted to have minor nonobstructive disease involving the LAD and left circumflex. A 2-D echocardiogram was obtained post cath demonstrating mild systolic dysfunction with an estimated ejection fraction of 45% with diffuse hypokinesis, worse in the septum and apex. He was placed on dual antiplatelet therapy with aspirin and Effient along with metoprolol, Imdur and Lipitor. Zetia and fenofibrate were also added to better treat his hyperlipidemia. Triglycerides were severely elevated at 727 mg/dL. LDL was not calculated due to triglyceride levels over 400. ACE/ARB therapy was not initiated due to borderline hypotension. Of note, his Hgb A1c was also elevated at 7.6. His metformin was restarted 48 hours post-cath. He was discharged home on 03/24/2014.  He was seen in followup a few weeks ago and was doing fairly well. No adjustments were made to his medications. He's had significant weight loss. In general he's done well with smoking cessation but occasionally he does smoke some cigarettes. He is requesting nicotine replacement  today. He denies any further chest pain. He is now back to work. He is getting some mild bruising with normal activities, probably related to his dual antiplatelet therapy.  I saw Albert Villegas back in the office today for follow-up. Overall he is without complaints. He denies any chest pain or worsening shortness of breath. At his last office visit we talked about coming off of Effient. It's been more than a year since he had a stent placed and I think we can effectively do that today. I also think he would do well without isosorbide. He denies any chest pain.  PMHx:  Past Medical History  Diagnosis Date  . Tear of medial cartilage or meniscus of knee, current 02/2012    left  . Sprain and strain of cruciate ligament of knee 02/2012    left ACL  . GERD (gastroesophageal reflux disease)   . Depression   . ST elevation myocardial infarction (STEMI) of inferior wall (HCC) 03/22/14    stent DES to RCA  . Acute renal failure (HCC) 03/22/14    dehydration resolved  . Hyperlipidemia LDL goal <70   . DM2 (diabetes mellitus, type 2) (HCC)   . Tobacco use 03/24/2014  . Sleep apnea in adult     Past Surgical History  Procedure Laterality Date  . Reduction of torsion of testis  age 67  . Knee ligament reconstruction    . Coronary angioplasty with stent placement  03/22/14    emergent DES to RCA with thrombectomy  . Left heart catheterization with coronary angiogram N/A 03/22/2014    Procedure: LEFT HEART CATHETERIZATION WITH CORONARY ANGIOGRAM;  Surgeon: Micheline Chapman, MD;  Location: [facility] CATH  LAB;  Service: Cardiovascular;  Laterality: N/A;    FAMHx:  Family History  Problem Relation Age of Onset  . Hyperlipidemia Other   . Hypertension Other     SOCHx:   reports that he has been smoking Cigarettes.  He has a 15 pack-year smoking history. He has never used smokeless tobacco. He reports that he drinks alcohol. He reports that he does not use illicit drugs.  ALLERGIES:  No Known Allergies  ROS: A  comprehensive review of systems was negative except for: Hematologic/lymphatic: positive for easy bruising  HOME MEDS: Current Outpatient Prescriptions  Medication Sig Dispense Refill  . acetaminophen (TYLENOL) 325 MG tablet Take 2 tablets (650 mg total) by mouth every 4 (four) hours as neMarland Kitcheneded for headache or mild pain.    . amLODipine (NORVASC) 5 MG tablet TAKE 1 TABLET (5 MG TOTAL) BY MOUTH DAILY. 90 tablet 3  . ARIPiprazole (ABILIFY) 5 MG tablet Take 5 mg by mouth daily.    Marland Kitchen aspirin EC 81 MG tablet Take 81 mg by mouth daily.    Marland Kitchen atorvastatin (LIPITOR) 80 MG tablet TAKE 1 TABLET (80 MG TOTAL) BY MOUTH DAILY AT 6 PM. 90 tablet 3  . Dapagliflozin-Metformin HCl ER (XIGDUO XR) 01-999 MG TB24 Take 1 tablet by mouth daily.    Marland Kitchen desvenlafaxine (PRISTIQ) 50 MG 24 hr tablet Take 50 mg by mouth every morning.     . ezetimibe (ZETIA) 10 MG tablet Take 10 mg by mouth daily.    . fenofibrate 160 MG tablet Take 160 mg by mouth daily.    Marland Kitchen losartan (COZAAR) 50 MG tablet Take 50 mg by mouth daily.  6  . Magnesium 250 MG TABS Take 250 mg by mouth daily.    . metoprolol (LOPRESSOR) 50 MG tablet Take 1 tablet (50 mg total) by mouth 2 (two) times daily. 180 tablet 3  . Multiple Vitamin (MULTIVITAMIN) tablet Take 1 tablet by mouth daily.    . nicotine (NICODERM CQ - DOSED IN MG/24 HOURS) 14 mg/24hr patch Place 1 patch (14 mg total) onto the skin daily. 28 patch 0  . nitroGLYCERIN (NITROSTAT) 0.4 MG SL tablet Place 1 tablet (0.4 mg total) under the tongue every 5 (five) minutes x 3 doses as needed for chest pain. 25 tablet 4  . omeprazole (PRILOSEC OTC) 20 MG tablet Take 20 mg by mouth daily as needed.      No current facility-administered medications for this visit.    LABS/IMAGING: No results found for this or any previous visit (from the past 48 hour(s)). No results found.  VITALS: BP 142/78 mmHg  Pulse 96  Ht  (1.753 m)  Wt 239 lb 4.8 oz (108.546 kg)  BMI 35.32 kg/m2  EXAM: General  appearance: alert and no distress Neck: no carotid bruit and no JVD Lungs: clear to auscultation bilaterally Heart: regular rate and rhythm, S1, S2 normal, no murmur, click, rub or gallop Abdomen: soft, non-tender; bowel sounds normal; no masses,  no organomegaly Extremities: extremities normal, atraumatic, no cyanosis or edema Pulses: 2+ and symmetric Skin: Skin color, texture, turgor normal. No rashes or lesions Neurologic: Grossly normal Psych: Pleasant, does not appear depressed or manic today  EKG: Normal sinus rhythm at 96  ASSESSMENT: 1. CAD status post drug-eluting stent to the right coronary for ST elevation MI in 03/2014 2. Hypertension 3. Dyslipidemia 4. Tobacco abuse 5. Bipolar disorder 6. Type 2 diabetes  PLAN: 1.   Mr. Biebel is doing well now and  he continues to improve. I recommended discontinuing Effient and isosorbide today in the office. He should continue his other medications. Cholesterol is followed by his primary care provider. His blood pressure is reasonably well controlled today. He is to continue to work on exercise and weight loss. Plan to see him back annually or sooner as necessary. Should he have any further chest discomfort after stopping long acting nitroglycerin, he could use his short acting nitroglycerin but should contact our office.  Chrystie Nose, MD, [facility] Attending Cardiologist [facility]  Chrystie Nose 07/05/2015, 1:55 PM

## 2015-07-20 ENCOUNTER — Encounter: Payer: Self-pay | Admitting: Internal Medicine

## 2015-07-26 ENCOUNTER — Other Ambulatory Visit: Payer: Self-pay | Admitting: Internal Medicine

## 2015-08-02 ENCOUNTER — Other Ambulatory Visit: Payer: Self-pay | Admitting: Internal Medicine

## 2015-08-27 ENCOUNTER — Other Ambulatory Visit: Payer: Self-pay | Admitting: Internal Medicine

## 2015-11-29 ENCOUNTER — Other Ambulatory Visit: Payer: Self-pay | Admitting: Internal Medicine

## 2015-11-30 NOTE — Telephone Encounter (Signed)
Rx request sent to pharmacy.  

## 2015-12-09 ENCOUNTER — Telehealth: Payer: Self-pay | Admitting: Internal Medicine

## 2015-12-09 NOTE — Telephone Encounter (Signed)
Pt called in c/o of CP that is intermittent sharp that has been happening for the past few hours. Pt tried one dose of his NItro and it did not help. Pt stated that nothing makes it better or worse and it is not like his previous MI. Pt stated that he is very nervous from his history of heart problems. Pt has no other c/o of SOB, pain radiating, or dizziness. Pt concerns reviewed with PA, Leron Croak, and was told this is not cardiac related and pt should call 911 if it becomes contstant or pressure/thightnes. Pt verbalized understanding no additional questions. Pt also asked to verify that expretaion date is up to date on his Nitro bottle and let us know if he needs refills.

## 2016-07-24 ENCOUNTER — Other Ambulatory Visit: Payer: Self-pay | Admitting: Internal Medicine

## 2016-08-02 ENCOUNTER — Encounter: Payer: Self-pay | Admitting: Internal Medicine

## 2016-08-02 ENCOUNTER — Ambulatory Visit (INDEPENDENT_AMBULATORY_CARE_PROVIDER_SITE_OTHER): Payer: PRIVATE HEALTH INSURANCE | Admitting: Internal Medicine

## 2016-08-02 VITALS — BP 110/80 | HR 78 | Ht 69.0 in | Wt 232.2 lb

## 2016-08-02 DIAGNOSIS — E7801 Familial hypercholesterolemia: Secondary | ICD-10-CM | POA: Diagnosis not present

## 2016-08-02 DIAGNOSIS — I10 Essential (primary) hypertension: Secondary | ICD-10-CM

## 2016-08-02 DIAGNOSIS — I2510 Atherosclerotic heart disease of native coronary artery without angina pectoris: Secondary | ICD-10-CM

## 2016-08-02 DIAGNOSIS — Z955 Presence of coronary angioplasty implant and graft: Secondary | ICD-10-CM

## 2016-08-02 NOTE — Patient Instructions (Addendum)
Medication Instructions:  Your physician recommends that you continue on your current medications as directed. Please refer to the Current Medication list given to you today.  Labwork: None   Testing/Procedures: none  Follow-Up: Your physician wants you to follow-up in: 12 MONTHS WITH DR HILTY. You will receive a reminder letter in the mail two months in advance. If you don't receive a letter, please call our office to schedule the follow-up appointment.  Any Other Special Instructions Will Be Listed Below (If Applicable).  NEEDS APPOINTMENT WITH LIPID CLINIC FOR START OF REPATHA   If you need a refill on your cardiac medications before your next appointment, please call your pharmacy.

## 2016-08-02 NOTE — Progress Notes (Signed)
OFFICE NOTE  Chief Complaint:  Follow-up, no complaints  Primary Care Physician: Martha Clan, MD  HPI:  Albert Villegas is a 45 year old moderately obese male with a history of GERD, tobacco abuse, treated type 2 diabetes mellitus and hypertension who presented to [facility] on 03/22/2014 with complaints of chest pain started about 45 minutes prior to presentation. He had radiation of pain down his left arm and also noted dizziness, nausea and vomiting. No diaphoresis. EKG demonstrated 2 mm ST elevations inferiorly with depressions in V1 and V2. Code STEMI was activated and he was transported urgently to the cath lab for urgent coronary catheterization. This was performed by Dr. Excell Seltzer. He was found to have a complex stenosis of the mid to distal right coronary artery. This was successfully treated with PCI utilizing aspiration thrombectomy and a drug-eluting stent. He was also noted to have minor nonobstructive disease involving the LAD and left circumflex. A 2-D echocardiogram was obtained post cath demonstrating mild systolic dysfunction with an estimated ejection fraction of 45% with diffuse hypokinesis, worse in the septum and apex. He was placed on dual antiplatelet therapy with aspirin and Effient along with metoprolol, Imdur and Lipitor. Zetia and fenofibrate were also added to better treat his hyperlipidemia. Triglycerides were severely elevated at 727 mg/dL. LDL was not calculated due to triglyceride levels over 400. ACE/ARB therapy was not initiated due to borderline hypotension. Of note, his Hgb A1c was also elevated at 7.6. His metformin was restarted 48 hours post-cath. He was discharged home on 03/24/2014.  He was seen in followup a few weeks ago and was doing fairly well. No adjustments were made to his medications. He's had significant weight loss. In general he's done well with smoking cessation but occasionally he does smoke some cigarettes. He is requesting  nicotine replacement Villegas. He denies any further chest pain. He is now back to work. He is getting some mild bruising with normal activities, probably related to his dual antiplatelet therapy.  I saw Albert Villegas back in the office Villegas for follow-up. Overall he is without complaints. He denies any chest pain or worsening shortness of breath. At his last office visit we talked about coming off of Effient. It's been more than a year since he had a stent placed and I think we can effectively do that Villegas. I also think he would do well without isosorbide. He denies any chest pain.  08/02/2016  Albert Villegas for follow-up. It's been a year since I saw him. Unfortunately he's had no cardiovascular events over the past year. He had recent blood work from his primary care provider which showed total cholesterol 193 and LDL 111. Triglycerides were 265, but known to be greater than 1000 while not on a fibrate. His hemoglobin A1c is 7.5. He has managed to lose about 8 pounds since last year but he says currently it's plateaued. He denies any worsening shortness of breath or chest pain with exertion. Given his early onset coronary disease and significant dyslipidemia suspect he has FH. Untreated cholesterol of 318. Dutch score of 8.  PMHx:  Past Medical History:  Diagnosis Date  . Acute renal failure (HCC) 03/22/14   dehydration resolved  . Depression   . DM2 (diabetes mellitus, type 2) (HCC)   . GERD (gastroesophageal reflux disease)   . Hyperlipidemia LDL goal <70   . Sleep apnea in adult   . Sprain and strain of cruciate ligament of knee 02/2012   left ACL  .  ST elevation myocardial infarction (STEMI) of inferior wall (HCC) 03/22/14   stent DES to RCA  . Tear of medial cartilage or meniscus of knee, current 02/2012   left  . Tobacco use 03/24/2014    Past Surgical History:  Procedure Laterality Date  . CORONARY ANGIOPLASTY WITH STENT PLACEMENT  03/22/14   emergent DES to RCA with thrombectomy  .  KNEE LIGAMENT RECONSTRUCTION    . LEFT HEART CATHETERIZATION WITH CORONARY ANGIOGRAM N/A 03/22/2014   Procedure: LEFT HEART CATHETERIZATION WITH CORONARY ANGIOGRAM;  Surgeon: Micheline Chapman, MD;  Location: [facility] CATH LAB;  Service: Cardiovascular;  Laterality: N/A;  . REDUCTION OF TORSION OF TESTIS  age 40    FAMHx:  Family History  Problem Relation Age of Onset  . Hyperlipidemia Other   . Hypertension Other     SOCHx:   reports that he has been smoking Cigarettes.  He has a 15.00 pack-year smoking history. He has never used smokeless tobacco. He reports that he drinks alcohol. He reports that he does not use drugs.  ALLERGIES:  No Known Allergies  ROS: Pertinent items noted in HPI and remainder of comprehensive ROS otherwise negative.  HOME MEDS: Current Outpatient Prescriptions  Medication Sig Dispense Refill  . acetaminophen (TYLENOL) 325 MG tablet Take 2 tablets (650 mg total) by mouth every 4 (four) hours as needed for headache or mild pain.    Marland Kitchen amLODipine (NORVASC) 5 MG tablet TAKE 1 TABLET (5 MG TOTAL) BY MOUTH DAILY. 90 tablet 1  . ARIPiprazole (ABILIFY) 5 MG tablet Take 5 mg by mouth daily.    Marland Kitchen aspirin EC 81 MG tablet Take 81 mg by mouth daily.    Marland Kitchen atorvastatin (LIPITOR) 80 MG tablet TAKE 1 TABLET (80 MG TOTAL) BY MOUTH DAILY AT 6 PM. 90 tablet 3  . Dapagliflozin-Metformin HCl ER (XIGDUO XR) 01-999 MG TB24 Take 1 tablet by mouth daily.    Marland Kitchen desvenlafaxine (PRISTIQ) 50 MG 24 hr tablet Take 50 mg by mouth every morning.     . ezetimibe (ZETIA) 10 MG tablet Take 10 mg by mouth daily.    . fenofibrate 160 MG tablet Take 160 mg by mouth daily.    Marland Kitchen losartan (COZAAR) 50 MG tablet Take 50 mg by mouth daily.  6  . Magnesium 250 MG TABS Take 250 mg by mouth daily.    . metFORMIN (GLUCOPHAGE) 1000 MG tablet Take 1,000 mg by mouth 2 (two) times daily.  11  . metoprolol (LOPRESSOR) 50 MG tablet TAKE 1 TABLET BY MOUTH TWICE A DAY 60 tablet 7  . Multiple Vitamin (MULTIVITAMIN)  tablet Take 1 tablet by mouth daily.    . nicotine (NICODERM CQ - DOSED IN MG/24 HOURS) 14 mg/24hr patch Place 1 patch (14 mg total) onto the skin daily. 28 patch 0  . nitroGLYCERIN (NITROSTAT) 0.4 MG SL tablet Place 1 tablet (0.4 mg total) under the tongue every 5 (five) minutes x 3 doses as needed for chest pain. 25 tablet 4  . omeprazole (PRILOSEC OTC) 20 MG tablet Take 20 mg by mouth daily as needed.      No current facility-administered medications for this visit.     LABS/IMAGING: No results found for this or any previous visit (from the past 48 hour(s)). No results found.  VITALS: BP 110/80 (BP Location: Left Arm, Patient Position: Sitting, Cuff Size: Normal)   Pulse 78   Ht 5\' 9"  (1.753 m)   Wt 232 lb 4 oz (105.3 kg)  BMI 34.30 kg/m   EXAM: General appearance: alert and no distress Neck: no carotid bruit and no JVD Lungs: clear to auscultation bilaterally Heart: regular rate and rhythm, S1, S2 normal, no murmur, click, rub or gallop Abdomen: soft, non-tender; bowel sounds normal; no masses,  no organomegaly Extremities: extremities normal, atraumatic, no cyanosis or edema Pulses: 2+ and symmetric Skin: Skin color, texture, turgor normal. No rashes or lesions Neurologic: Grossly normal Psych: Pleasant, does not appear depressed or manic Villegas  EKG: Normal sinus rhythm at 78  ASSESSMENT: 1. CAD status post drug-eluting stent to the right coronary for ST elevation MI in 03/2014 2. Hypertension 3. 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 Tobacco abuse 5. Bipolar disorder 6. Type 2 diabetes  PLAN: 1.   Albert Villegas has fortunately not had any coronary events over the last year. His cholesterol remains elevated with LDL 111 on 80 mg of Lipitor, 10 mg as that area and 160 mg of fenofibrate. His Dutch score is 8. He has an untreated LDL cholesterol of 318 in retrospective review. This is consistent with familial hypercholesterolemia. He will need additional  aggressive treatment. I'm recommending starting Repatha for additional lipid lowering. He needs to continue on weight loss and improvement in blood sugars as his hemoglobin A1c remains at 7.5.  Follow-up with me annually.  Chrystie NoseKenneth C. Jakelyn Squyres, MD, [facility] Attending Cardiologist [facility]  Lisette AbuKenneth C [facility] 08/02/2016, 8:15 AM

## 2016-08-25 ENCOUNTER — Ambulatory Visit: Payer: PRIVATE HEALTH INSURANCE

## 2016-09-11 ENCOUNTER — Other Ambulatory Visit: Payer: Self-pay | Admitting: Internal Medicine

## 2016-09-13 NOTE — Telephone Encounter (Signed)
Rx has been sent to the pharmacy electronically. ° °

## 2016-10-25 ENCOUNTER — Ambulatory Visit (INDEPENDENT_AMBULATORY_CARE_PROVIDER_SITE_OTHER): Payer: PRIVATE HEALTH INSURANCE | Admitting: Pharmacist Clinician (PhC)/ Clinical Pharmacy Specialist

## 2016-10-25 DIAGNOSIS — E785 Hyperlipidemia, unspecified: Secondary | ICD-10-CM

## 2016-10-25 NOTE — Assessment & Plan Note (Signed)
Because last labs are more than 16 month old, will have patient get them drawn this week.  Once we have those will submit to his insurance for Repatha 140 mg q 14 days.   Patient was encouraged to continue working on weight loss and better eating habits, as well as starting an exercise regimen.

## 2016-10-25 NOTE — Progress Notes (Signed)
10/25/2016 Albert Villegas 01/06/1971 [id number]   HPI:  Albert Villegas is a 46 y.o. male patient of Dr Rennis Golden, who presents today for a lipid clinic evaluation.   His cardiac history is significant for DM2, GERD, hypertension, hyperlipidemia and STEMI (at age 48).  He is a former smoker, having quit shortly after having his STEMI.  His PCP is Dr. Martha Clan at [facility], and has most of his lab work drawn there.  His last lipids were in August of 2017 and are noted below.    Current Medications:  Atorvastatin 80 mg qd, ezetimibe 10 mg qd, fenofibrate 160 mg qd  Cholesterol Goals:  LDL < 70  Family history:   Maternal grandfather died from MI in is 59's, paternal grandfather from heart failure; parents and sister without cardiac disease  Diet:   Eats mostly home cooked foods, has tried to cut back on breads and pastas, does still eat some rice/potatoes.  White meats, rare beef, now some fish (salmon and tilapia).  He does admit to drinking 20+ beers per week.    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  Labs:  04/2016:  TC 170, TG 359, HDL 27, LDL 71 11/2015:  TC 193, TG 265, HLD 29, LDL 111 (atorvastatin 80, ezetimibe 10, fenofibrate 160) 07/2015:  TC 161, TG 275, HDL 27, LDL 79 06/2014:  TC 165, TG 315, HDL 26, LDL 76  Current Outpatient Prescriptions  Medication Sig Dispense Refill  . acetaminophen (TYLENOL) 325 MG tablet Take 2 tablets (650 mg total) by mouth every 4 (four) hours as needed for headache or mild pain.    Marland Kitchen amLODipine (NORVASC) 5 MG tablet TAKE 1 TABLET (5 MG TOTAL) BY MOUTH DAILY. 90 tablet 1  . ARIPiprazole (ABILIFY) 5 MG tablet Take 5 mg by mouth daily.    Marland Kitchen aspirin EC 81 MG tablet Take 81 mg by mouth daily.    Marland Kitchen atorvastatin (LIPITOR) 80 MG tablet TAKE 1 TABLET (80 MG TOTAL) BY MOUTH DAILY AT 6 PM. 90 tablet 2  . Dapagliflozin-Metformin HCl ER (XIGDUO XR) 01-999 MG TB24 Take 1 tablet by mouth  daily.    Marland Kitchen desvenlafaxine (PRISTIQ) 50 MG 24 hr tablet Take 50 mg by mouth every morning.     . ezetimibe (ZETIA) 10 MG tablet Take 10 mg by mouth daily.    . fenofibrate 160 MG tablet Take 160 mg by mouth daily.    Marland Kitchen losartan (COZAAR) 50 MG tablet Take 50 mg by mouth daily.  6  . Magnesium 250 MG TABS Take 250 mg by mouth daily.    . metFORMIN (GLUCOPHAGE) 1000 MG tablet Take 1,000 mg by mouth 2 (two) times daily.  11  . metoprolol (LOPRESSOR) 50 MG tablet TAKE 1 TABLET BY MOUTH TWICE A DAY 60 tablet 7  . Multiple Vitamin (MULTIVITAMIN) tablet Take 1 tablet by mouth daily.    . nicotine (NICODERM CQ - DOSED IN MG/24 HOURS) 14 mg/24hr patch Place 1 patch (14 mg total) onto the skin daily. 28 patch 0  . nitroGLYCERIN (NITROSTAT) 0.4 MG SL tablet Place 1 tablet (0.4 mg total) under the tongue every 5 (five) minutes x 3 doses as needed for chest pain. 25 tablet 4  . omeprazole (PRILOSEC OTC) 20 MG tablet Take 20 mg by mouth daily as needed.      No current facility-administered medications for this visit.  No Known Allergies  Past Medical History:  Diagnosis Date  . Acute renal failure (HCC) 03/22/14   dehydration resolved  . Depression   . DM2 (diabetes mellitus, type 2) (HCC)   . GERD (gastroesophageal reflux disease)   . Hyperlipidemia LDL goal <70   . Sleep apnea in adult   . Sprain and strain of cruciate ligament of knee 02/2012   left ACL  . ST elevation myocardial infarction (STEMI) of inferior wall (HCC) 03/22/14   stent DES to RCA  . Tear of medial cartilage or meniscus of knee, current 02/2012   left  . Tobacco use 03/24/2014    There were no vitals taken for this visit.   No problem-specific Assessment & Plan notes found for this encounter.   Phillips Hay PharmD CPP [facility]

## 2016-10-25 NOTE — Patient Instructions (Signed)
We will start paperwork for Repatha injections this week.  Continue with all your current medications.  If you labs are more than 55 days old we may need to repeat them.  Will call you if that becomes necessary.   Cholesterol Cholesterol is a fat. Your body needs a small amount of cholesterol. Cholesterol (plaque) may build up in your blood vessels (arteries). That makes you more likely to have a heart attack or stroke. You cannot feel your cholesterol level. Having a blood test is the only way to find out if your level is high. Keep your test results. Work with your doctor to keep your cholesterol at a good level. What do the results mean?  Total cholesterol is how much cholesterol is in your blood.  LDL is bad cholesterol. This is the type that can build up. Try to have low LDL.  HDL is good cholesterol. It cleans your blood vessels and carries LDL away. Try to have high HDL.  Triglycerides are fat that the body can store or burn for energy. What are good levels of cholesterol?  Total cholesterol below 200.  LDL below 100 is good for people who have health risks. LDL below 70 is good for people who have very high risks.  HDL above 40 is good. It is best to have HDL of 60 or higher.  Triglycerides below 150. How can I lower my cholesterol? Diet  Follow your diet program as told by your doctor.  Choose fish, white meat chicken, or [place] that is roasted or baked. Try not to eat red meat, fried foods, sausage, or lunch meats.  Eat lots of fresh fruits and vegetables.  Choose whole grains, beans, pasta, potatoes, and cereals.  Choose olive oil, corn oil, or canola oil. Only use small amounts.  Try not to eat butter, mayonnaise, shortening, or palm kernel oils.  Try not to eat foods with trans fats.  Choose low-fat or nonfat dairy foods.  Drink skim or nonfat milk.  Eat low-fat or nonfat yogurt and cheeses.  Try not to drink whole milk or cream.  Try not to eat ice  cream, egg yolks, or full-fat cheeses.  Healthy desserts include angel food cake, ginger snaps, animal crackers, hard candy, popsicles, and low-fat or nonfat frozen yogurt. Try not to eat pastries, cakes, pies, and cookies. Exercise  Follow your exercise program as told by your doctor.  Be more active. Try gardening, walking, and taking the stairs.  Ask your doctor about ways that you can be more active. Medicine  Take over-the-counter and prescription medicines only as told by your doctor. This information is not intended to replace advice given to you by your health care provider. Make sure you discuss any questions you have with your health care provider. Document Released: 11/25/2008 Document Revised: 03/30/2016 Document Reviewed: 03/10/2016 Elsevier Interactive Patient Education  2017 [employer].

## 2016-10-31 ENCOUNTER — Other Ambulatory Visit: Payer: Self-pay | Admitting: Internal Medicine

## 2016-11-01 ENCOUNTER — Other Ambulatory Visit: Payer: Self-pay | Admitting: Internal Medicine

## 2016-11-01 LAB — LIPID PANEL
CHOLESTEROL: 171 mg/dL (ref <200)
HDL: 24 mg/dL — ABNORMAL LOW (ref >40)
Total CHOL/HDL Ratio: 7.1 Ratio — ABNORMAL HIGH (ref <5.0)
Triglycerides: 411 mg/dL — ABNORMAL HIGH (ref <150)

## 2016-11-01 LAB — HEPATIC FUNCTION PANEL
ALBUMIN: 4.4 g/dL (ref 3.6–5.1)
ALK PHOS: 34 U/L — AB (ref 40–115)
ALT: 13 U/L (ref 9–46)
AST: 34 U/L (ref 10–40)
BILIRUBIN INDIRECT: 0.2 mg/dL (ref 0.2–1.2)
Bilirubin, Direct: 0.1 mg/dL (ref <=0.2)
TOTAL PROTEIN: 6.6 g/dL (ref 6.1–8.1)
Total Bilirubin: 0.3 mg/dL (ref 0.2–1.2)

## 2016-11-01 LAB — LDL CHOLESTEROL, DIRECT: Direct LDL: 90 mg/dL (ref <130)

## 2016-11-03 ENCOUNTER — Telehealth: Payer: Self-pay | Admitting: Pharmacist Clinician (PhC)/ Clinical Pharmacy Specialist

## 2016-11-03 MED ORDER — ALIROCUMAB 75 MG/ML ~~LOC~~ SOPN: 75.0000 mg | PEN_INJECTOR | SUBCUTANEOUS | 12 refills | Status: DC

## 2016-11-03 NOTE — Telephone Encounter (Signed)
Insurance approved Praluent 75 mg q14 d until August 2018.  Rx sent to [facility] in [place].  Patient to stop by office to pick up copay card on Friday

## 2017-01-13 ENCOUNTER — Other Ambulatory Visit: Payer: Self-pay | Admitting: *Deleted

## 2017-01-13 DIAGNOSIS — I2510 Atherosclerotic heart disease of native coronary artery without angina pectoris: Secondary | ICD-10-CM

## 2017-01-13 DIAGNOSIS — E7801 Familial hypercholesterolemia: Secondary | ICD-10-CM

## 2017-01-13 DIAGNOSIS — I2119 ST elevation (STEMI) myocardial infarction involving other coronary artery of inferior wall: Secondary | ICD-10-CM

## 2017-01-13 DIAGNOSIS — E785 Hyperlipidemia, unspecified: Secondary | ICD-10-CM

## 2017-02-18 ENCOUNTER — Other Ambulatory Visit: Payer: Self-pay | Admitting: Internal Medicine

## 2017-05-02 ENCOUNTER — Telehealth: Payer: Self-pay | Admitting: Pharmacist

## 2017-05-02 NOTE — Telephone Encounter (Signed)
Talked to patient today. Plan to repeat blood work for PCP annual check up next week. Will get results to [place] ASAP.  Patient instructed to call office if Praluent 75mg  sample needed to avoid missed doses.

## 2017-05-18 ENCOUNTER — Other Ambulatory Visit: Payer: Self-pay | Admitting: Orthopedic Surgery

## 2017-05-18 DIAGNOSIS — M25569 Pain in unspecified knee: Secondary | ICD-10-CM

## 2017-05-31 ENCOUNTER — Other Ambulatory Visit: Payer: PRIVATE HEALTH INSURANCE

## 2017-06-16 ENCOUNTER — Ambulatory Visit
Admission: RE | Admit: 2017-06-16 | Discharge: 2017-06-16 | Disposition: A | Discharge status: Home / Self Care | Payer: PRIVATE HEALTH INSURANCE | Source: Ambulatory Visit | Attending: Orthopedic Surgery | Admitting: Orthopedic Surgery

## 2017-06-16 DIAGNOSIS — M25569 Pain in unspecified knee: Secondary | ICD-10-CM

## 2017-07-22 ENCOUNTER — Other Ambulatory Visit: Payer: Self-pay | Admitting: Internal Medicine

## 2017-11-02 ENCOUNTER — Other Ambulatory Visit: Payer: Self-pay | Admitting: Internal Medicine

## 2017-11-18 ENCOUNTER — Other Ambulatory Visit: Payer: Self-pay | Admitting: Internal Medicine

## 2017-12-19 ENCOUNTER — Other Ambulatory Visit: Payer: Self-pay | Admitting: Internal Medicine

## 2017-12-19 NOTE — Telephone Encounter (Signed)
REFILL 

## 2018-02-03 ENCOUNTER — Other Ambulatory Visit: Payer: Self-pay | Admitting: Internal Medicine

## 2018-03-27 ENCOUNTER — Other Ambulatory Visit: Payer: Self-pay | Admitting: Internal Medicine

## 2018-04-15 ENCOUNTER — Other Ambulatory Visit: Payer: Self-pay | Admitting: Internal Medicine

## 2018-05-01 ENCOUNTER — Other Ambulatory Visit: Payer: Self-pay

## 2018-05-01 ENCOUNTER — Other Ambulatory Visit: Payer: Self-pay | Admitting: Internal Medicine

## 2018-05-01 ENCOUNTER — Telehealth: Payer: Self-pay | Admitting: Internal Medicine

## 2018-05-01 MED ORDER — METOPROLOL TARTRATE 50 MG PO TABS: 50.0000 mg | ORAL_TABLET | Freq: Two times a day (BID) | ORAL | 1 refills | Status: DC

## 2018-05-01 NOTE — Telephone Encounter (Signed)
New Message:   *STAT* If patient is at the pharmacy, call can be transferred to refill team.   1. Which medications need to be refilled? (please list name of each medication and dose if known) metoprolol tartrate (LOPRESSOR) 50 MG tablet  2. Which pharmacy/location (including street and city if local pharmacy) is medication to be sent to?    [id number] - [place], [place] - [address]      3. Do they need a 30 day or 90 day supply? 30 Days

## 2018-05-02 NOTE — Telephone Encounter (Signed)
[facility]

## 2018-06-13 ENCOUNTER — Encounter: Payer: Self-pay | Admitting: Internal Medicine

## 2018-06-13 ENCOUNTER — Ambulatory Visit (INDEPENDENT_AMBULATORY_CARE_PROVIDER_SITE_OTHER): Payer: PRIVATE HEALTH INSURANCE | Admitting: Internal Medicine

## 2018-06-13 VITALS — BP 124/80 | HR 101 | Ht 69.0 in | Wt 249.0 lb

## 2018-06-13 DIAGNOSIS — Z955 Presence of coronary angioplasty implant and graft: Secondary | ICD-10-CM

## 2018-06-13 DIAGNOSIS — E119 Type 2 diabetes mellitus without complications: Secondary | ICD-10-CM | POA: Diagnosis not present

## 2018-06-13 DIAGNOSIS — I10 Essential (primary) hypertension: Secondary | ICD-10-CM | POA: Diagnosis not present

## 2018-06-13 DIAGNOSIS — E7801 Familial hypercholesterolemia: Secondary | ICD-10-CM

## 2018-06-13 DIAGNOSIS — Z794 Long term (current) use of insulin: Secondary | ICD-10-CM

## 2018-06-13 NOTE — Patient Instructions (Signed)
Dr. Rennis Golden recommends Repatha 140mg /mL (PCSK9). This is an injectable cholesterol medication. This medication will need prior approval with your insurance company, which we will work on. If the medication is not approved initially, we may need to do an appeal with your insurance. We will keep you updated on this process. This medication can be provided at some local pharmacies or be shipped to you from a specialty pharmacy.   To obtain a co-pay card (used with [employer]), please go to [email or web address]  Your physician recommends that you return for lab work after you have been on Repatha for 3 months  Your physician wants you to follow-up in: 12 months with Dr. Rennis Golden. You will receive a reminder email via MyChart. Please call to make an appointment once you receive this notice.

## 2018-06-13 NOTE — Progress Notes (Signed)
OFFICE NOTE  Chief Complaint:  Follow-up, no complaints  Primary Care Physician: Marton Redwood, MD  HPI:  Albert Villegas is a 47 year old moderately obese male with a history of GERD, tobacco abuse, treated type 2 diabetes mellitus and hypertension who presented to [facility] on 03/22/2014 with complaints of chest pain started about 45 minutes prior to presentation. He had radiation of pain down his left arm and also noted dizziness, nausea and vomiting. No diaphoresis. EKG demonstrated 2 mm ST elevations inferiorly with depressions in V1 and V2. Code STEMI was activated and he was transported urgently to the cath lab for urgent coronary catheterization. This was performed by Dr. Burt Knack. He was found to have a complex stenosis of the mid to distal right coronary artery. This was successfully treated with PCI utilizing aspiration thrombectomy and a drug-eluting stent. He was also noted to have minor nonobstructive disease involving the LAD and left circumflex. A 2-D echocardiogram was obtained post cath demonstrating mild systolic dysfunction with an estimated ejection fraction of 45% with diffuse hypokinesis, worse in the septum and apex. He was placed on dual antiplatelet therapy with aspirin and Effient along with metoprolol, Imdur and Lipitor. Zetia and fenofibrate were also added to better treat his hyperlipidemia. Triglycerides were severely elevated at 727 mg/dL. LDL was not calculated due to triglyceride levels over 400. ACE/ARB therapy was not initiated due to borderline hypotension. Of note, his Hgb A1c was also elevated at 7.6. His metformin was restarted 48 hours post-cath. He was discharged home on 03/24/2014.  He was seen in followup a few weeks ago and was doing fairly well. No adjustments were made to his medications. He's had significant weight loss. In general he's done well with smoking cessation but occasionally he does smoke some cigarettes. He is requesting  nicotine replacement today. He denies any further chest pain. He is now back to work. He is getting some mild bruising with normal activities, probably related to his dual antiplatelet therapy.  I saw Albert Villegas back in the office today for follow-up. Overall he is without complaints. He denies any chest pain or worsening shortness of breath. At his last office visit we talked about coming off of Effient. It's been more than a year since he had a stent placed and I think we can effectively do that today. I also think he would do well without isosorbide. He denies any chest pain.  08/02/2016  Albert Villegas returns today for follow-up. It's been a year since I saw him. Unfortunately he's had no cardiovascular events over the past year. He had recent blood work from his primary care provider which showed total cholesterol 193 and LDL 111. Triglycerides were 265, but known to be greater than 1000 while not on a fibrate. His hemoglobin A1c is 7.5. He has managed to lose about 8 pounds since last year but he says currently it's plateaued. He denies any worsening shortness of breath or chest pain with exertion. Given his early onset coronary disease and significant dyslipidemia suspect he has FH. Untreated cholesterol of 318. Albert Villegas score of 8.  06/13/2018  Albert Villegas returns today for follow-up.  Overall he says he feels well and denies chest pain or worsening shortness of breath.  As suspected he is likely to have FH.  He is most recent lipid profile showed total cholesterol 204, triglycerides 223, HDL 30 and LDL 129.  This is on high intensity atorvastatin 80 mg, Zetia 10 mg and fenofibrate 160 mg daily, along with  Vascepa 2 g twice daily.  Previously he had been on Praluent however it was not cost effective once he lost his job.  Now he has a new job and has reestablished insurance with [employer].  His PCP has recommended Repatha and I would agree with this as it is more likely to be covered.  He does need additional therapy to  reach target LDL.  Finally, I recommended that he have his sunscreen for dyslipidemia.  He is currently 47 years old, but if he inherited a genetic mutation it is possible that he may already have elevated LDL cholesterol.  Genetic testing is a possibility however it may be cost effective at this point.   PMHx:  Past Medical History:  Diagnosis Date  . Acute renal failure (HCC) 03/22/14   dehydration resolved  . Depression   . DM2 (diabetes mellitus, type 2) (HCC)   . GERD (gastroesophageal reflux disease)   . Hyperlipidemia LDL goal <70   . Sleep apnea in adult   . Sprain and strain of cruciate ligament of knee 02/2012   left ACL  . ST elevation myocardial infarction (STEMI) of inferior wall (HCC) 03/22/14   stent DES to RCA  . Tear of medial cartilage or meniscus of knee, current 02/2012   left  . Tobacco use 03/24/2014    Past Surgical History:  Procedure Laterality Date  . CORONARY ANGIOPLASTY WITH STENT PLACEMENT  03/22/14   emergent DES to RCA with thrombectomy  . KNEE LIGAMENT RECONSTRUCTION    . LEFT HEART CATHETERIZATION WITH CORONARY ANGIOGRAM N/A 03/22/2014   Procedure: LEFT HEART CATHETERIZATION WITH CORONARY ANGIOGRAM;  Surgeon: Micheline Chapman, MD;  Location: [facility] CATH LAB;  Service: Cardiovascular;  Laterality: N/A;  . REDUCTION OF TORSION OF TESTIS  age 63    FAMHx:  Family History  Problem Relation Age of Onset  . Hyperlipidemia Other   . Hypertension Other   . Heart attack Maternal Grandfather   . Heart failure Paternal Grandfather     SOCHx:   reports that he quit smoking about 4 years ago. His smoking use included cigarettes. He has a 15.00 pack-year smoking history. He has never used smokeless tobacco. He reports that he drinks alcohol. He reports that he does not use drugs.  ALLERGIES:  No Known Allergies  ROS: Pertinent items noted in HPI and remainder of comprehensive ROS otherwise negative.  HOME MEDS: Current Outpatient Medications  Medication Sig  Dispense Refill  . acetaminophen (TYLENOL) 325 MG tablet Take 2 tablets (650 mg total) by mouth every 4 (four) hours as needed for headache or mild pain.    Marland Kitchen amLODipine (NORVASC) 5 MG tablet TAKE 1 TABLET (5 MG TOTAL) BY MOUTH DAILY. 90 tablet 1  . amphetamine-dextroamphetamine (ADDERALL) 30 MG tablet Take 30 mg by mouth daily.    . ARIPiprazole (ABILIFY) 5 MG tablet Take 5 mg by mouth daily.    . Armodafinil (NUVIGIL) 250 MG tablet Take 250 mg by mouth daily.    Marland Kitchen aspirin EC 81 MG tablet Take 81 mg by mouth daily.    Marland Kitchen atorvastatin (LIPITOR) 80 MG tablet TAKE 1 TABLET (80 MG TOTAL) BY MOUTH DAILY AT 6 PM. 90 tablet 2  . Dapagliflozin-Metformin HCl ER (XIGDUO XR) 01-999 MG TB24 Take 1 tablet by mouth daily.    Marland Kitchen desvenlafaxine (PRISTIQ) 50 MG 24 hr tablet Take 50 mg by mouth every morning.     . empagliflozin (JARDIANCE) 25 MG TABS tablet Take 25 mg  by mouth daily.    Marland Kitchen ezetimibe (ZETIA) 10 MG tablet Take 10 mg by mouth daily.    . fenofibrate 160 MG tablet Take 160 mg by mouth daily.    Bess Harvest Ethyl (VASCEPA) 1 g CAPS Take by mouth. Takes 2 tablets in the morning and 2 at night    . Insulin Degludec-Liraglutide (XULTOPHY) 100-3.6 UNIT-MG/ML SOPN Inject 50 Units into the skin daily.    Marland Kitchen losartan (COZAAR) 50 MG tablet Take 50 mg by mouth daily.  6  . Magnesium 250 MG TABS Take 250 mg by mouth daily.    . metFORMIN (GLUCOPHAGE) 1000 MG tablet Take 1,000 mg by mouth 2 (two) times daily.  11  . metoprolol tartrate (LOPRESSOR) 50 MG tablet Take 1 tablet (50 mg total) by mouth 2 (two) times daily. Please keep your upcoming appointment to receive additional refills 30 tablet 1  . Multiple Vitamin (MULTIVITAMIN) tablet Take 1 tablet by mouth daily.    . nicotine (NICODERM CQ - DOSED IN MG/24 HOURS) 14 mg/24hr patch Place 1 patch (14 mg total) onto the skin daily. 28 patch 0  . nitroGLYCERIN (NITROSTAT) 0.4 MG SL tablet Place 1 tablet (0.4 mg total) under the tongue every 5 (five) minutes x 3  doses as needed for chest pain. 25 tablet 4  . omeprazole (PRILOSEC OTC) 20 MG tablet Take 20 mg by mouth daily as needed.      No current facility-administered medications for this visit.     LABS/IMAGING: No results found for this or any previous visit (from the past 48 hour(s)). No results found.  VITALS: BP 124/80   Pulse (!) 101   Ht 5\' 9"  (1.753 m)   Wt 249 lb (112.9 kg)   BMI 36.77 kg/m   EXAM: General appearance: alert and no distress Neck: no carotid bruit and no JVD Lungs: clear to auscultation bilaterally Heart: regular rate and rhythm, S1, S2 normal, no murmur, click, rub or gallop Abdomen: soft, non-tender; bowel sounds normal; no masses,  no organomegaly Extremities: extremities normal, atraumatic, no cyanosis or edema Pulses: 2+ and symmetric Skin: Skin color, texture, turgor normal. No rashes or lesions Neurologic: Grossly normal Psych: Pleasant, does not appear depressed or manic today  EKG: Sinus tachycardia 101, possible left atrial enlargement, inferior T wave changes-personally reviewed  ASSESSMENT: 1. CAD status post drug-eluting stent to the right coronary for ST elevation MI in 03/2014 2. Hypertension 3. Familial hypercholesterolemia - Albert Villegas score of 8 4. Tobacco abuse 5. Bipolar disorder 6. Type 2 diabetes  PLAN: 1.   Albert Villegas has FH clinically.  He has not reached goal LDL cholesterol on maximal medical therapy.  PCSK9 inhibitor is indicated.  Will pursue Repatha as is likely to be covered on his new insurance.  He is asymptomatic from a coronary standpoint.  I have advised screening lipid profile through his pediatrician of his son who is 10.  Certainly if the cholesterol is elevated then approved treatment with simvastatin or rosuvastatin is guideline indicated.  If the lipid profile is normal, then FH is not excluded however repeat tests in the future are recommended.  Follow-up with me annually or sooner as necessary.  Chrystie Nose,  MD, [facility], FACP  [place]  [facility]  Medical Director of the [facility]  [facility] Diplomate of the American Board of Clinical Lipidology Attending Cardiologist  Direct Dial: [phone]  Fax: [phone]  Website:  [email or web address]   Lisette Abu  Avelynn Sellin 06/13/2018, 4:04 PM

## 2018-06-23 ENCOUNTER — Other Ambulatory Visit: Payer: Self-pay | Admitting: Internal Medicine

## 2018-07-02 ENCOUNTER — Telehealth: Payer: Self-pay | Admitting: Internal Medicine

## 2018-07-02 ENCOUNTER — Other Ambulatory Visit: Payer: Self-pay | Admitting: Internal Medicine

## 2018-07-02 NOTE — Telephone Encounter (Signed)
PA for repatha sureclick submitted via [email or web address] Key: [id number]

## 2018-07-04 NOTE — Telephone Encounter (Signed)
Received notice from [facility] that Repatha SureClick has been approved thru 01/01/2019.   Patient notified via MyChart

## 2018-07-06 MED ORDER — EVOLOCUMAB 140 MG/ML ~~LOC~~ SOAJ: 1.0000 | SUBCUTANEOUS | 11 refills | Status: DC

## 2018-07-06 NOTE — Addendum Note (Signed)
Addended by: Lindell Spar on: 07/06/2018 01:15 PM   Modules accepted: Orders

## 2018-07-06 NOTE — Addendum Note (Signed)
Addended by: Lindell Spar on: 07/06/2018 01:39 PM   Modules accepted: Orders

## 2018-07-06 NOTE — Telephone Encounter (Signed)
Rx sent to [facility] per patient message

## 2018-07-12 IMAGING — MR MR KNEE*R* W/O CM
4 of 5 series · 22 of 40 positions shown · non-contrast
Comparison: None.

CLINICAL DATA: Right lateral knee pain for 3 months. No known
injury.

EXAM:
MRI OF THE RIGHT KNEE WITHOUT CONTRAST
TECHNIQUE: Multiplanar, multisequence MR imaging of the knee was performed. No
intravenous contrast was administered.

[Series 3: PD fat-sat · axial · 4.0mm · 0.42mm/px · z∈[-22,+93]mm · 7 of 25 slices shown (1 of 3)]
[im 1/25]
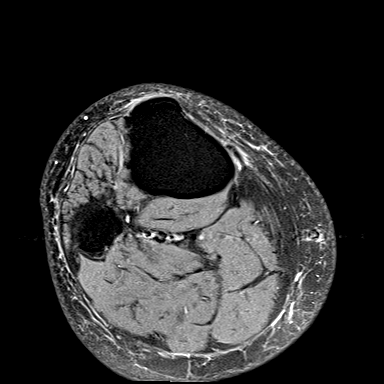
[im 5/25]
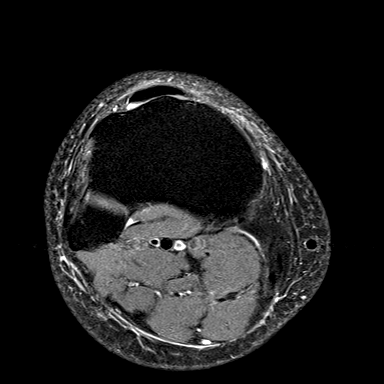
[im 9/25]
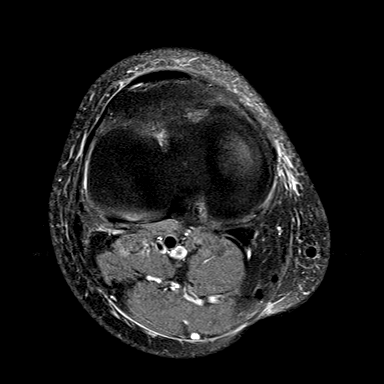
[im 13/25]
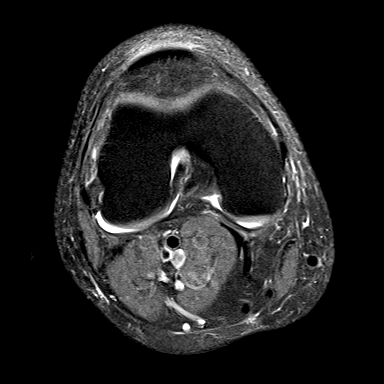
[im 17/25]
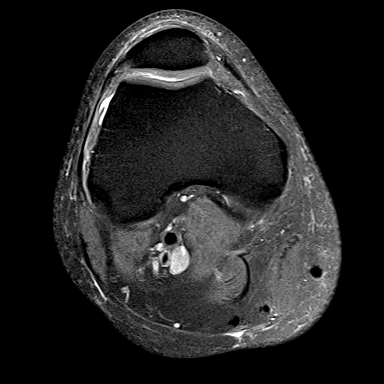
[im 21/25]
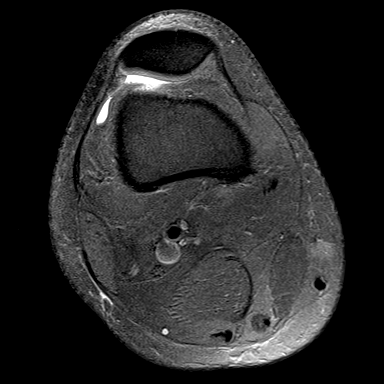
[im 25/25]
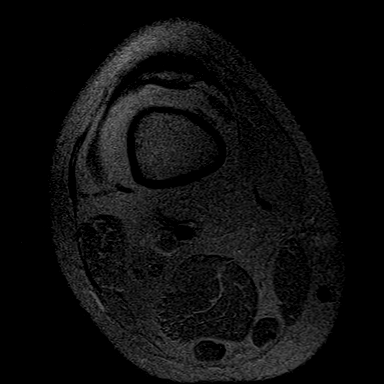

[Series 5: T2 fat-sat · coronal · 3.0mm · 0.29mm/px · 3 of 34 slices shown]
[im 5/34]
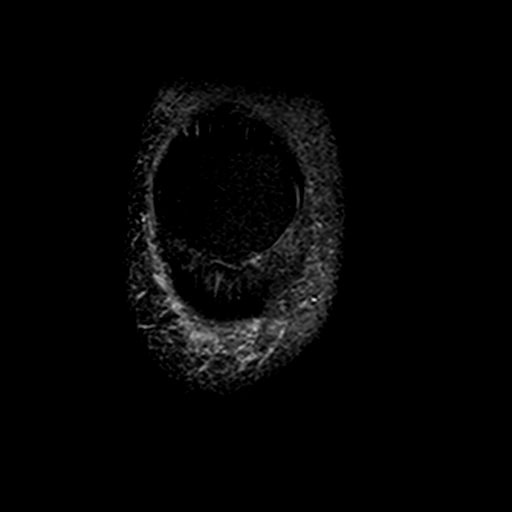
[im 17/34]
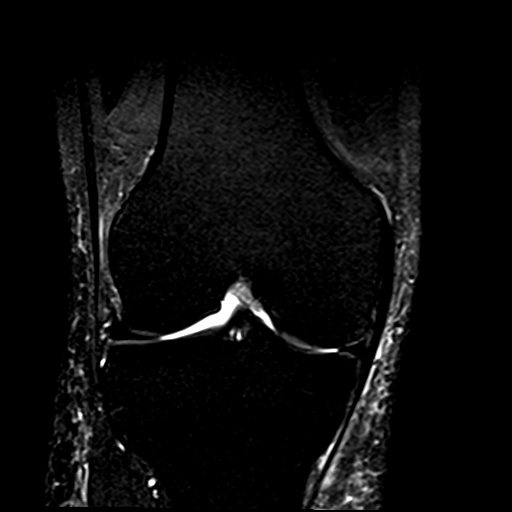
[im 29/34]
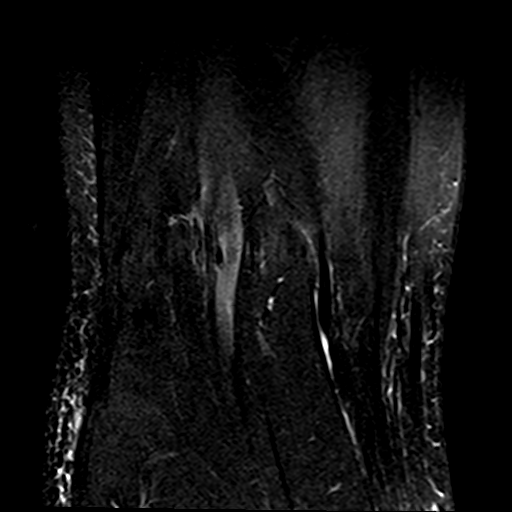

[Series 6: PD fat-sat · coronal · 3.0mm · 0.29mm/px · 9 of 34 slices shown (2 of 3)]
[im 1/34]
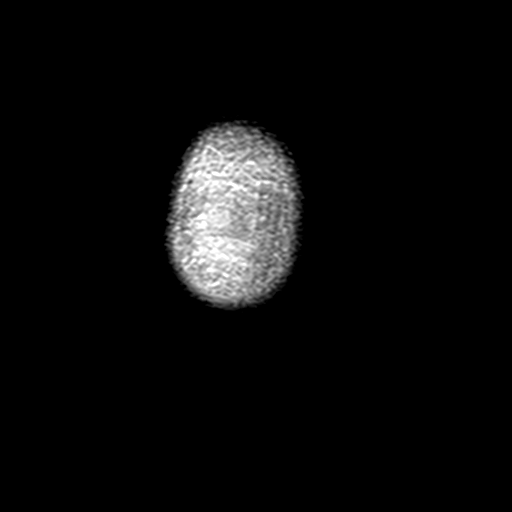
[im 5/34]
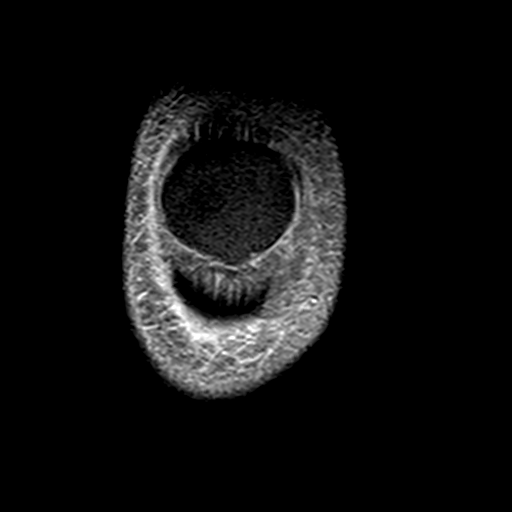
[im 9/34]
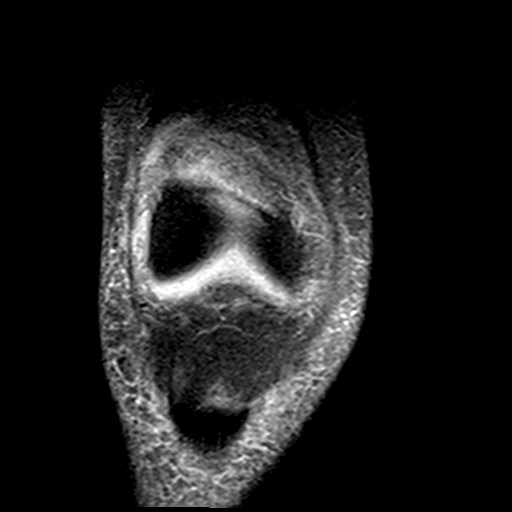
[im 13/34]
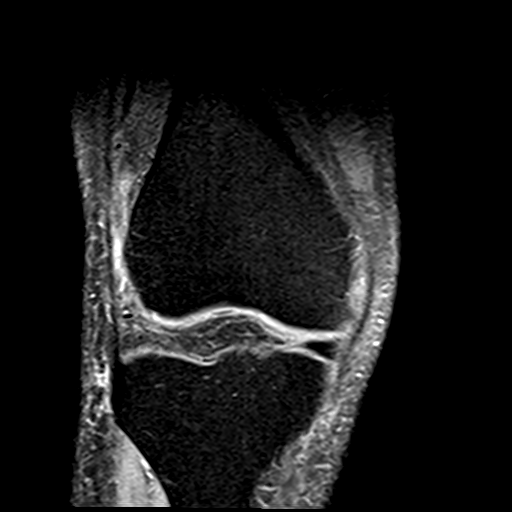
[im 17/34]
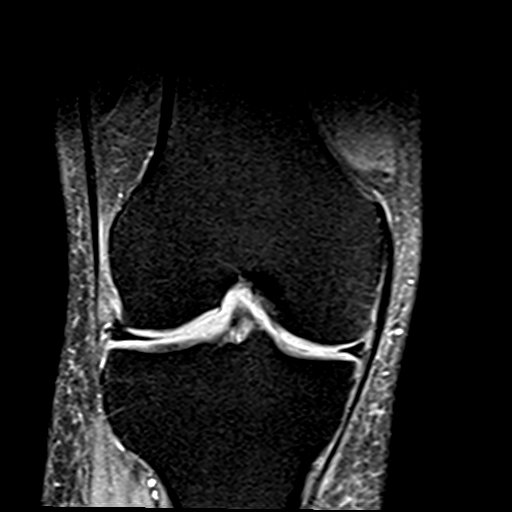
[im 21/34]
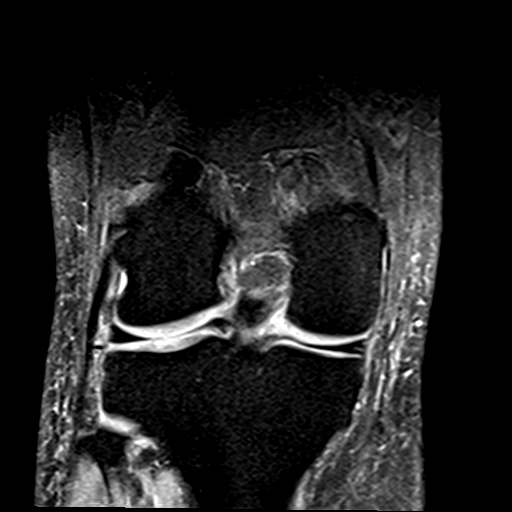
[im 25/34]
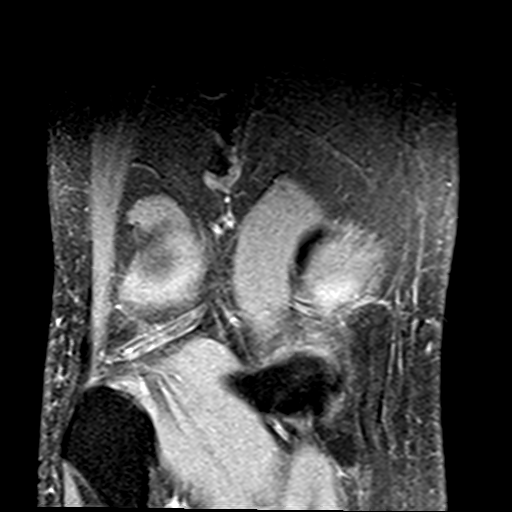
[im 29/34]
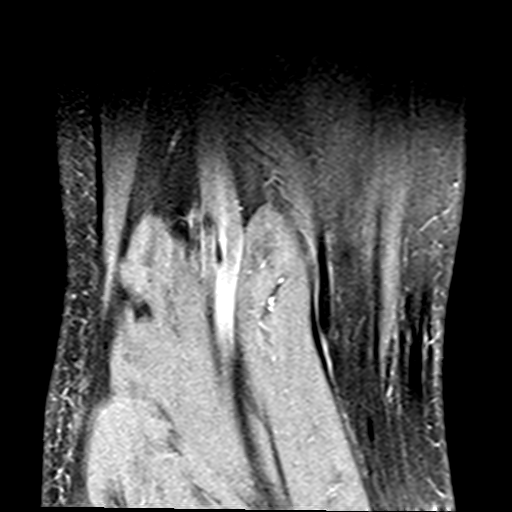
[im 34/34]
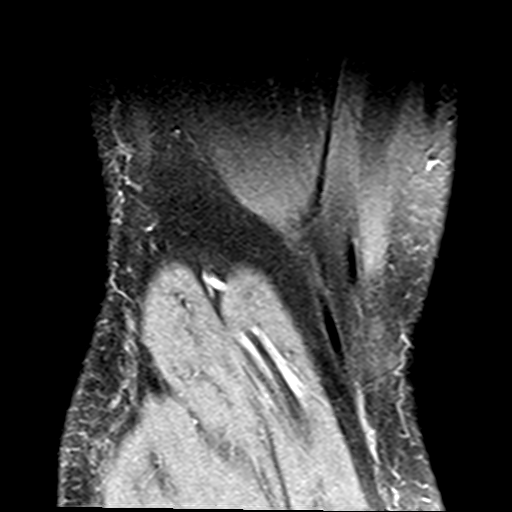

[Series 7: PD fat-sat · sagittal · 4.0mm · 0.31mm/px · 3 of 22 slices shown (3 of 3)]
[im 5/22]
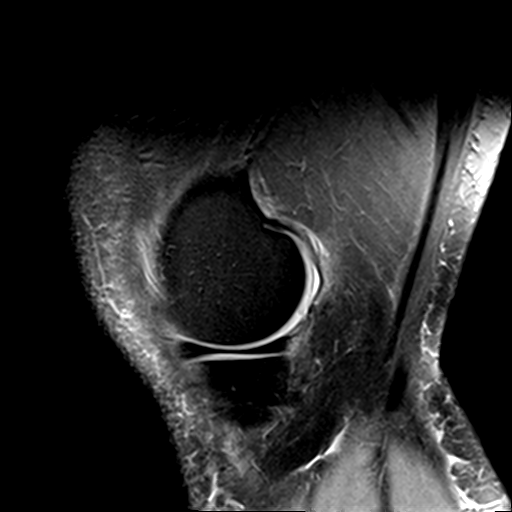
[im 13/22]
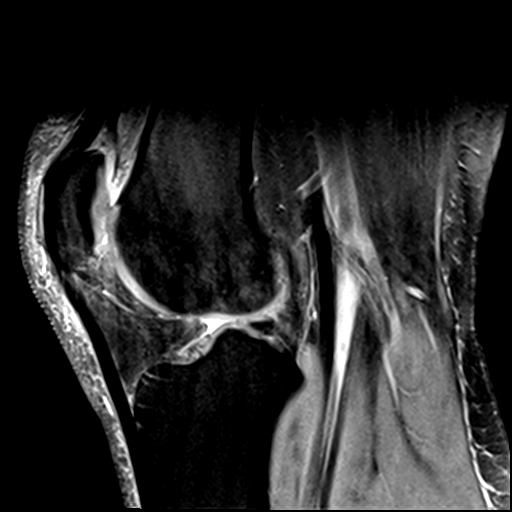
[im 22/22]
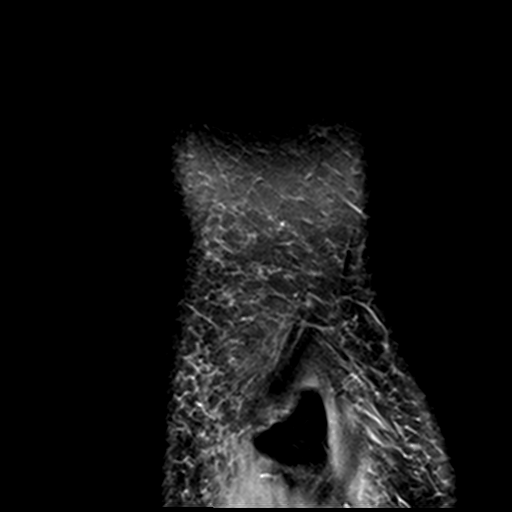

[22 of 40 positions shown; findings below may reference images not displayed]

FINDINGS: MENISCI

Medial meniscus:  Intact.

Lateral meniscus:  Intact.

LIGAMENTS

Cruciates:  Intact ACL and PCL.

Collaterals: Medial collateral ligament is intact. Lateral
collateral ligament complex is intact.

CARTILAGE

Patellofemoral:  No chondral defect.

Medial:  Mild superficial irregularity without focal defect.

Lateral: Superficial irregularity with small focal partial-thickness
cartilage loss along the central lateral tibial plateau. No
full-thickness defect.

Joint:  No joint effusion. Normal Hoffa's fat. No plical thickening.

Popliteal Fossa: No significant Baker cyst. Intact popliteus tendon.

Extensor Mechanism:  Intact quadriceps tendon and patellar tendon.

Bones: No focal marrow signal abnormality. No fracture or
dislocation.

Other: None.
IMPRESSION: 1. No evidence of internal derangement.
2. Minimal medial and lateral compartment degenerative changes.

## 2018-10-09 ENCOUNTER — Encounter: Payer: Self-pay | Admitting: *Deleted

## 2019-01-31 NOTE — Telephone Encounter (Signed)
LMTCB to discuss MyChart message sent 01/18/2019 concerning repatha

## 2019-06-25 ENCOUNTER — Other Ambulatory Visit: Payer: Self-pay | Admitting: Internal Medicine

## 2019-08-29 ENCOUNTER — Other Ambulatory Visit: Payer: Self-pay | Admitting: Internal Medicine

## 2019-09-12 ENCOUNTER — Other Ambulatory Visit: Payer: Self-pay | Admitting: Internal Medicine

## 2019-09-21 ENCOUNTER — Other Ambulatory Visit: Payer: Self-pay | Admitting: Internal Medicine

## 2019-10-15 ENCOUNTER — Other Ambulatory Visit: Payer: Self-pay | Admitting: Internal Medicine

## 2019-10-24 ENCOUNTER — Ambulatory Visit: Payer: Commercial Managed Care - PPO | Admitting: Internal Medicine

## 2019-10-24 ENCOUNTER — Encounter: Payer: Self-pay | Admitting: Internal Medicine

## 2019-10-24 ENCOUNTER — Other Ambulatory Visit: Payer: Self-pay

## 2019-10-24 VITALS — BP 128/78 | HR 92 | Ht 69.0 in | Wt 231.2 lb

## 2019-10-24 DIAGNOSIS — E7801 Familial hypercholesterolemia: Secondary | ICD-10-CM

## 2019-10-24 DIAGNOSIS — E119 Type 2 diabetes mellitus without complications: Secondary | ICD-10-CM | POA: Diagnosis not present

## 2019-10-24 DIAGNOSIS — I10 Essential (primary) hypertension: Secondary | ICD-10-CM | POA: Diagnosis not present

## 2019-10-24 DIAGNOSIS — Z955 Presence of coronary angioplasty implant and graft: Secondary | ICD-10-CM | POA: Diagnosis not present

## 2019-10-24 DIAGNOSIS — Z794 Long term (current) use of insulin: Secondary | ICD-10-CM

## 2019-10-24 MED ORDER — ICOSAPENT ETHYL 1 G PO CAPS: 2.0000 g | ORAL_CAPSULE | Freq: Two times a day (BID) | ORAL | 3 refills | Status: DC

## 2019-10-24 MED ORDER — ATORVASTATIN CALCIUM 80 MG PO TABS: 80.0000 mg | ORAL_TABLET | Freq: Every day | ORAL | 3 refills | Status: DC

## 2019-10-24 MED ORDER — AMLODIPINE BESYLATE 5 MG PO TABS: 5.0000 mg | ORAL_TABLET | Freq: Every day | ORAL | 3 refills | Status: DC

## 2019-10-24 MED ORDER — FENOFIBRATE 160 MG PO TABS: 160.0000 mg | ORAL_TABLET | Freq: Every day | ORAL | 3 refills | Status: DC

## 2019-10-24 MED ORDER — LOSARTAN POTASSIUM 50 MG PO TABS: 50.0000 mg | ORAL_TABLET | Freq: Every day | ORAL | 3 refills | Status: DC

## 2019-10-24 MED ORDER — METOPROLOL TARTRATE 50 MG PO TABS: 50.0000 mg | ORAL_TABLET | Freq: Two times a day (BID) | ORAL | 3 refills | Status: AC

## 2019-10-24 NOTE — Progress Notes (Signed)
OFFICE NOTE  Chief Complaint:  Follow-up, no complaints  Primary Care Physician: Marton Redwood, MD  HPI:  Albert Villegas is a 49 year old moderately obese male with a history of GERD, tobacco abuse, treated type 2 diabetes mellitus and hypertension who presented to [facility] on 03/22/2014 with complaints of chest pain started about 45 minutes prior to presentation. He had radiation of pain down his left arm and also noted dizziness, nausea and vomiting. No diaphoresis. EKG demonstrated 2 mm ST elevations inferiorly with depressions in V1 and V2. Code STEMI was activated and he was transported urgently to the cath lab for urgent coronary catheterization. This was performed by Dr. Burt Knack. He was found to have a complex stenosis of the mid to distal right coronary artery. This was successfully treated with PCI utilizing aspiration thrombectomy and a drug-eluting stent. He was also noted to have minor nonobstructive disease involving the LAD and left circumflex. A 2-D echocardiogram was obtained post cath demonstrating mild systolic dysfunction with an estimated ejection fraction of 45% with diffuse hypokinesis, worse in the septum and apex. He was placed on dual antiplatelet therapy with aspirin and Effient along with metoprolol, Imdur and Lipitor. Zetia and fenofibrate were also added to better treat his hyperlipidemia. Triglycerides were severely elevated at 727 mg/dL. LDL was not calculated due to triglyceride levels over 400. ACE/ARB therapy was not initiated due to borderline hypotension. Of note, his Hgb A1c was also elevated at 7.6. His metformin was restarted 48 hours post-cath. He was discharged home on 03/24/2014.  He was seen in followup a few weeks ago and was doing fairly well. No adjustments were made to his medications. He's had significant weight loss. In general he's done well with smoking cessation but occasionally he does smoke some cigarettes. He is requesting  nicotine replacement today. He denies any further chest pain. He is now back to work. He is getting some mild bruising with normal activities, probably related to his dual antiplatelet therapy.  I saw Albert Villegas back in the office today for follow-up. Overall he is without complaints. He denies any chest pain or worsening shortness of breath. At his last office visit we talked about coming off of Effient. It's been more than a year since he had a stent placed and I think we can effectively do that today. I also think he would do well without isosorbide. He denies any chest pain.  08/02/2016  Albert Villegas returns today for follow-up. It's been a year since I saw him. Unfortunately he's had no cardiovascular events over the past year. He had recent blood work from his primary care provider which showed total cholesterol 193 and LDL 111. Triglycerides were 265, but known to be greater than 1000 while not on a fibrate. His hemoglobin A1c is 7.5. He has managed to lose about 8 pounds since last year but he says currently it's plateaued. He denies any worsening shortness of breath or chest pain with exertion. Given his early onset coronary disease and significant dyslipidemia suspect he has FH. Untreated cholesterol of 318. Dutch score of 8.  06/13/2018  Albert Villegas returns today for follow-up.  Overall he says he feels well and denies chest pain or worsening shortness of breath.  As suspected he is likely to have FH.  He is most recent lipid profile showed total cholesterol 204, triglycerides 223, HDL 30 and LDL 129.  This is on high intensity atorvastatin 80 mg, Zetia 10 mg and fenofibrate 160 mg daily, along with  Vascepa 2 g twice daily.  Previously he had been on Praluent however it was not cost effective once he lost his job.  Now he has a new job and has reestablished insurance with [employer].  His PCP has recommended Repatha and I would agree with this as it is more likely to be covered.  He does need additional therapy to  reach target LDL.  Finally, I recommended that he have his sunscreen for dyslipidemia.  He is currently 49 years old, but if he inherited a genetic mutation it is possible that he may already have elevated LDL cholesterol.  Genetic testing is a possibility however it may be cost effective at this point.   10/24/2019  Albert Villegas is seen today in follow-up.  He has been recently difficult to get a hold of.  We wanted to repeat labs.  He did have some labs through his PCP although did a virtual visit.  His total cholesterol in May was 192, triglycerides were up at 293, HDL 34 and LDL 99.  His target LDL is less than 70, but we have had a difficult time in achieving that.  He would really benefit from a PCSK9 inhibitor, but says he is received 2 letters of denial from Praluent.  We previously discussed Repatha but it does not seem we been able to pursue that.  Diabetes is also not well controlled with hemoglobin A1c of 8.5.  He denies any worsening chest pain or recurrent symptoms.  He does report some mild numbness in the left arm which is not associated with chest pain and comes and goes.  PMHx:  Past Medical History:  Diagnosis Date  . Acute renal failure (HCC) 03/22/14   dehydration resolved  . Depression   . DM2 (diabetes mellitus, type 2) (HCC)   . GERD (gastroesophageal reflux disease)   . Hyperlipidemia LDL goal <70   . Sleep apnea in adult   . Sprain and strain of cruciate ligament of knee 02/2012   left ACL  . ST elevation myocardial infarction (STEMI) of inferior wall (HCC) 03/22/14   stent DES to RCA  . Tear of medial cartilage or meniscus of knee, current 02/2012   left  . Tobacco use 03/24/2014    Past Surgical History:  Procedure Laterality Date  . CORONARY ANGIOPLASTY WITH STENT PLACEMENT  03/22/14   emergent DES to RCA with thrombectomy  . KNEE LIGAMENT RECONSTRUCTION    . LEFT HEART CATHETERIZATION WITH CORONARY ANGIOGRAM N/A 03/22/2014   Procedure: LEFT HEART CATHETERIZATION WITH  CORONARY ANGIOGRAM;  Surgeon: Micheline Chapman, MD;  Location: [facility] CATH LAB;  Service: Cardiovascular;  Laterality: N/A;  . REDUCTION OF TORSION OF TESTIS  age 65    FAMHx:  Family History  Problem Relation Age of Onset  . Hyperlipidemia Other   . Hypertension Other   . Heart attack Maternal Grandfather   . Heart failure Paternal Grandfather     SOCHx:   reports that he quit smoking about 5 years ago. His smoking use included cigarettes. He has a 15.00 pack-year smoking history. He has never used smokeless tobacco. He reports current alcohol use. He reports that he does not use drugs.  ALLERGIES:  No Known Allergies  ROS: Pertinent items noted in HPI and remainder of comprehensive ROS otherwise negative.  HOME MEDS: Current Outpatient Medications  Medication Sig Dispense Refill  . acetaminophen (TYLENOL) 325 MG tablet Take 2 tablets (650 mg total) by mouth every 4 (four) hours as needed for  headache or mild pain.    Marland Kitchen amLODipine (NORVASC) 5 MG tablet Take 1 tablet (5 mg total) by mouth daily. Please schedule annual appt with Dr. Rennis Golden for refills. [phone]. 1st attempt. 30 tablet 0  . amphetamine-dextroamphetamine (ADDERALL) 30 MG tablet Take 30 mg by mouth daily.    . ARIPiprazole (ABILIFY) 5 MG tablet Take 5 mg by mouth daily.    Marland Kitchen aspirin EC 81 MG tablet Take 81 mg by mouth daily.    Marland Kitchen atorvastatin (LIPITOR) 80 MG tablet TAKE 1 TABLET (80 MG TOTAL) BY MOUTH DAILY. OFFICE VISIT NEEDED 30 tablet 0  . Dapagliflozin-Metformin HCl ER (XIGDUO XR) 01-999 MG TB24 Take 1 tablet by mouth daily.    Marland Kitchen desvenlafaxine (PRISTIQ) 50 MG 24 hr tablet Take 50 mg by mouth every morning.     . empagliflozin (JARDIANCE) 25 MG TABS tablet Take 25 mg by mouth daily.    . fenofibrate 160 MG tablet Take 160 mg by mouth daily.    Bess Harvest Ethyl (VASCEPA) 1 g CAPS Take by mouth. Takes 2 tablets in the morning and 2 at night    . Insulin Degludec-Liraglutide (XULTOPHY) 100-3.6 UNIT-MG/ML SOPN Inject  50 Units into the skin daily.    Marland Kitchen losartan (COZAAR) 50 MG tablet Take 50 mg by mouth daily.  6  . Magnesium 250 MG TABS Take 250 mg by mouth daily.    . metFORMIN (GLUCOPHAGE) 1000 MG tablet Take 1,000 mg by mouth 2 (two) times daily.  11  . metoprolol tartrate (LOPRESSOR) 50 MG tablet TAKE 1 TABLET BY MOUTH TWICE A DAY 60 tablet 9  . Multiple Vitamin (MULTIVITAMIN) tablet Take 1 tablet by mouth daily.    . nicotine (NICODERM CQ - DOSED IN MG/24 HOURS) 14 mg/24hr patch Place 1 patch (14 mg total) onto the skin daily. 28 patch 0  . nitroGLYCERIN (NITROSTAT) 0.4 MG SL tablet Place 1 tablet (0.4 mg total) under the tongue every 5 (five) minutes x 3 doses as needed for chest pain. 25 tablet 4  . omeprazole (PRILOSEC OTC) 20 MG tablet Take 20 mg by mouth daily as needed.      No current facility-administered medications for this visit.    LABS/IMAGING: No results found for this or any previous visit (from the past 48 hour(s)). No results found.  VITALS: There were no vitals taken for this visit.  EXAM: General appearance: alert and no distress Neck: no carotid bruit and no JVD Lungs: clear to auscultation bilaterally Heart: regular rate and rhythm, S1, S2 normal, no murmur, click, rub or gallop Abdomen: soft, non-tender; bowel sounds normal; no masses,  no organomegaly Extremities: extremities normal, atraumatic, no cyanosis or edema Pulses: 2+ and symmetric Skin: Skin color, texture, turgor normal. No rashes or lesions Neurologic: Grossly normal Psych: Pleasant, does not appear depressed or manic today  EKG: Normal sinus rhythm at 92, inferior T wave changes-personally reviewed (no change compared to prior EKG)  ASSESSMENT: 1. CAD status post drug-eluting stent to the right coronary for ST elevation MI in 03/2014 2. Hypertension 3. 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 Tobacco abuse 5. Bipolar disorder 6. Type 2 diabetes  PLAN: 1.   Mr. Space continues to have  elevated cholesterol despite a multidrug regimen.  His LDL remains above target less than 70.  He is an ideal candidate for PCSK9 inhibitor.  Unfortunately he has been denied Praluent for unknown reasons.  I have previously suggested pursuing Repatha as an alternative.  We will need an updated lipid profile which I will order today and then appeal to insurance for coverage of this medication.  Follow-up with me in 3 to 4 months with repeat lipids.  Chrystie Nose, MD, [facility], FACP  [place]  [facility]  Medical Director of the Advanced Lipid Disorders &  Cardiovascular Risk Reduction Clinic Diplomate of the American Board of Clinical Lipidology Attending Cardiologist  Direct Dial: [phone]  Fax: [phone]  Website:  [email or web address] 10/24/2019, 11:34 AM

## 2019-10-24 NOTE — Patient Instructions (Signed)
Medication Instructions:  No changes at this time *If you need a refill on your cardiac medications before your next appointment, please call your pharmacy*  Lab Work: Your physician recommends that you return for lab work (fasting Lipids) If you have labs (blood work) drawn today and your tests are completely normal, you will receive your results only by: Marland Kitchen MyChart Message (if you have MyChart) OR . A paper copy in the mail If you have any lab test that is abnormal or we need to change your treatment, we will call you to review the results.  Testing/Procedures: None  Follow-Up: At [facility], you and your health needs are our priority.  As part of our continuing mission to provide you with exceptional heart care, we have created designated Provider Care Teams.  These Care Teams include your primary Cardiologist (physician) and Advanced Practice Providers (APPs -  Physician Assistants and Nurse Practitioners) who all work together to provide you with the care you need, when you need it.  Your next appointment:   3 month(s)  The format for your next appointment:   In Person  Provider:   Dr. Rennis Golden

## 2019-10-28 ENCOUNTER — Other Ambulatory Visit (INDEPENDENT_AMBULATORY_CARE_PROVIDER_SITE_OTHER): Payer: Commercial Managed Care - PPO

## 2019-10-28 DIAGNOSIS — I10 Essential (primary) hypertension: Secondary | ICD-10-CM

## 2019-11-20 LAB — LIPID PANEL
Chol/HDL Ratio: 5.7 ratio — ABNORMAL HIGH (ref 0.0–5.0)
Cholesterol, Total: 218 mg/dL — ABNORMAL HIGH (ref 100–199)
HDL: 38 mg/dL — ABNORMAL LOW (ref >39)
LDL Chol Calc (NIH): 117 mg/dL — ABNORMAL HIGH (ref 0–99)
Triglycerides: 361 mg/dL — ABNORMAL HIGH (ref 0–149)
VLDL Cholesterol Cal: 63 mg/dL — ABNORMAL HIGH (ref 5–40)

## 2019-11-21 ENCOUNTER — Telehealth: Payer: Self-pay | Admitting: Internal Medicine

## 2019-11-21 NOTE — Telephone Encounter (Signed)
PA for repatha sureclick submitted via CMM (Key: [id number])

## 2019-11-22 NOTE — Telephone Encounter (Signed)
Request Reference Number: [id number]. REPATHA SURE INJ 140MG /ML is approved through 05/23/2020  Patient notified via MyChart

## 2019-11-25 MED ORDER — REPATHA SURECLICK 140 MG/ML ~~LOC~~ SOAJ: 1.0000 | SUBCUTANEOUS | 11 refills | Status: DC

## 2019-11-25 NOTE — Telephone Encounter (Signed)
Rx sent to [facility] on [address] per request

## 2019-11-25 NOTE — Addendum Note (Signed)
Addended by: Lindell Spar on: 11/25/2019 02:31 PM   Modules accepted: Orders

## 2020-01-23 ENCOUNTER — Other Ambulatory Visit: Payer: Self-pay

## 2020-01-23 ENCOUNTER — Encounter: Payer: Self-pay | Admitting: Internal Medicine

## 2020-01-23 ENCOUNTER — Ambulatory Visit: Payer: Commercial Managed Care - PPO | Admitting: Internal Medicine

## 2020-01-23 VITALS — BP 166/84 | HR 84 | Ht 69.0 in | Wt 232.8 lb

## 2020-01-23 DIAGNOSIS — Z955 Presence of coronary angioplasty implant and graft: Secondary | ICD-10-CM | POA: Diagnosis not present

## 2020-01-23 DIAGNOSIS — I10 Essential (primary) hypertension: Secondary | ICD-10-CM | POA: Diagnosis not present

## 2020-01-23 DIAGNOSIS — E7801 Familial hypercholesterolemia: Secondary | ICD-10-CM | POA: Diagnosis not present

## 2020-01-23 DIAGNOSIS — E78019 Familial hypercholesterolemia, unspecified: Secondary | ICD-10-CM

## 2020-01-23 NOTE — Progress Notes (Signed)
OFFICE NOTE  Chief Complaint:  Follow-up dyslipidemia  Primary Care Physician: Martha Clan, MD  HPI:  Albert Villegas is a 49 year old moderately obese male with a history of GERD, tobacco abuse, treated type 2 diabetes mellitus and hypertension who presented to [facility] on 03/22/2014 with complaints of chest pain started about 45 minutes prior to presentation. He had radiation of pain down his left arm and also noted dizziness, nausea and vomiting. No diaphoresis. EKG demonstrated 2 mm ST elevations inferiorly with depressions in V1 and V2. Code STEMI was activated and he was transported urgently to the cath lab for urgent coronary catheterization. This was performed by Dr. Excell Seltzer. He was found to have a complex stenosis of the mid to distal right coronary artery. This was successfully treated with PCI utilizing aspiration thrombectomy and a drug-eluting stent. He was also noted to have minor nonobstructive disease involving the LAD and left circumflex. A 2-D echocardiogram was obtained post cath demonstrating mild systolic dysfunction with an estimated ejection fraction of 45% with diffuse hypokinesis, worse in the septum and apex. He was placed on dual antiplatelet therapy with aspirin and Effient along with metoprolol, Imdur and Lipitor. Zetia and fenofibrate were also added to better treat his hyperlipidemia. Triglycerides were severely elevated at 727 mg/dL. LDL was not calculated due to triglyceride levels over 400. ACE/ARB therapy was not initiated due to borderline hypotension. Of note, his Hgb A1c was also elevated at 7.6. His metformin was restarted 48 hours post-cath. He was discharged home on 03/24/2014.  He was seen in followup a few weeks ago and was doing fairly well. No adjustments were made to his medications. He's had significant weight loss. In general he's done well with smoking cessation but occasionally he does smoke some cigarettes. He is requesting nicotine  replacement today. He denies any further chest pain. He is now back to work. He is getting some mild bruising with normal activities, probably related to his dual antiplatelet therapy.  I saw Albert Villegas back in the office today for follow-up. Overall he is without complaints. He denies any chest pain or worsening shortness of breath. At his last office visit we talked about coming off of Effient. It's been more than a year since he had a stent placed and I think we can effectively do that today. I also think he would do well without isosorbide. He denies any chest pain.  08/02/2016  Albert Villegas returns today for follow-up. It's been a year since I saw him. Unfortunately he's had no cardiovascular events over the past year. He had recent blood work from his primary care provider which showed total cholesterol 193 and LDL 111. Triglycerides were 265, but known to be greater than 1000 while not on a fibrate. His hemoglobin A1c is 7.5. He has managed to lose about 8 pounds since last year but he says currently it's plateaued. He denies any worsening shortness of breath or chest pain with exertion. Given his early onset coronary disease and significant dyslipidemia suspect he has FH. Untreated cholesterol of 318. Dutch score of 8.  06/13/2018  Albert Villegas returns today for follow-up.  Overall he says he feels well and denies chest pain or worsening shortness of breath.  As suspected he is likely to have FH.  He is most recent lipid profile showed total cholesterol 204, triglycerides 223, HDL 30 and LDL 129.  This is on high intensity atorvastatin 80 mg, Zetia 10 mg and fenofibrate 160 mg daily, along with Vascepa  2 g twice daily.  Previously he had been on Praluent however it was not cost effective once he lost his job.  Now he has a new job and has reestablished insurance with [employer].  His PCP has recommended Repatha and I would agree with this as it is more likely to be covered.  He does need additional therapy to reach  target LDL.  Finally, I recommended that he have his sunscreen for dyslipidemia.  He is currently 48 years old, but if he inherited a genetic mutation it is possible that he may already have elevated LDL cholesterol.  Genetic testing is a possibility however it may be cost effective at this point.   10/24/2019  Albert Villegas is seen today in follow-up.  He has been recently difficult to get a hold of.  We wanted to repeat labs.  He did have some labs through his PCP although did a virtual visit.  His total cholesterol in May was 192, triglycerides were up at 293, HDL 34 and LDL 99.  His target LDL is less than 70, but we have had a difficult time in achieving that.  He would really benefit from a PCSK9 inhibitor, but says he is received 2 letters of denial from Praluent.  We previously discussed Repatha but it does not seem we been able to pursue that.  Diabetes is also not well controlled with hemoglobin A1c of 8.5.  He denies any worsening chest pain or recurrent symptoms.  He does report some mild numbness in the left arm which is not associated with chest pain and comes and goes.  01/23/2020  Albert Villegas returns today for follow-up.  Unfortunately was originally denied Praluent, but then approved for Repatha.  He just started taking the medicine probably 2 months ago and has had 4 doses.  He has not had repeat lipids.  Blood pressure is elevated today.  He says he does not follow it regularly at home.  He does not own a blood pressure cuff and I encouraged him to purchase one.  He denies any chest pain or shortness of breath.  A repeat EKG was performed today as his prior EKG showed some new inferior T wave inversions.  Those have resolved and his EKG shows sinus rhythm today at 84.  PMHx:  Past Medical History:  Diagnosis Date  . Acute renal failure (HCC) 03/22/14   dehydration resolved  . Depression   . DM2 (diabetes mellitus, type 2) (HCC)   . GERD (gastroesophageal reflux disease)   . Hyperlipidemia LDL goal  <70   . Sleep apnea in adult   . Sprain and strain of cruciate ligament of knee 02/2012   left ACL  . ST elevation myocardial infarction (STEMI) of inferior wall (HCC) 03/22/14   stent DES to RCA  . Tear of medial cartilage or meniscus of knee, current 02/2012   left  . Tobacco use 03/24/2014    Past Surgical History:  Procedure Laterality Date  . CORONARY ANGIOPLASTY WITH STENT PLACEMENT  03/22/14   emergent DES to RCA with thrombectomy  . KNEE LIGAMENT RECONSTRUCTION    . LEFT HEART CATHETERIZATION WITH CORONARY ANGIOGRAM N/A 03/22/2014   Procedure: LEFT HEART CATHETERIZATION WITH CORONARY ANGIOGRAM;  Surgeon: Micheline Chapman, MD;  Location: [facility] CATH LAB;  Service: Cardiovascular;  Laterality: N/A;  . REDUCTION OF TORSION OF TESTIS  age 89    FAMHx:  Family History  Problem Relation Age of Onset  . Hyperlipidemia Other   . Hypertension  Other   . Heart attack Maternal Grandfather   . Heart failure Paternal Grandfather     SOCHx:   reports that he quit smoking about 5 years ago. His smoking use included cigarettes. He has a 15.00 pack-year smoking history. He has never used smokeless tobacco. He reports current alcohol use. He reports that he does not use drugs.  ALLERGIES:  No Known Allergies  ROS: Pertinent items noted in HPI and remainder of comprehensive ROS otherwise negative.  HOME MEDS: Current Outpatient Medications  Medication Sig Dispense Refill  . Alirocumab (PRALUENT) 75 MG/ML SOAJ Inject into the skin.    Marland Kitchen amLODipine (NORVASC) 5 MG tablet Take 1 tablet (5 mg total) by mouth daily. 90 tablet 3  . amphetamine-dextroamphetamine (ADDERALL) 20 MG tablet Take 30 mg by mouth daily.     . ARIPiprazole (ABILIFY) 10 MG tablet Take 5 mg by mouth daily.     Marland Kitchen aspirin EC 81 MG tablet Take 81 mg by mouth daily.    Marland Kitchen atorvastatin (LIPITOR) 80 MG tablet Take 1 tablet (80 mg total) by mouth daily. 90 tablet 3  . desvenlafaxine (PRISTIQ) 100 MG 24 hr tablet Take 50 mg by mouth  every morning.     . diclofenac (CATAFLAM) 50 MG tablet Take 50 mg by mouth 2 (two) times daily as needed.    . empagliflozin (JARDIANCE) 25 MG TABS tablet Take 25 mg by mouth daily.    . Evolocumab (REPATHA SURECLICK) 185 MG/ML SOAJ Inject 1 Dose into the skin every 14 (fourteen) days. 2 pen 11  . fenofibrate 160 MG tablet Take 1 tablet (160 mg total) by mouth daily. 90 tablet 3  . icosapent Ethyl (VASCEPA) 1 g capsule Take 2 capsules (2 g total) by mouth 2 (two) times daily. Takes 2 tablets in the morning and 2 at night 360 capsule 3  . losartan (COZAAR) 50 MG tablet Take 1 tablet (50 mg total) by mouth daily. 90 tablet 3  . metFORMIN (GLUCOPHAGE) 1000 MG tablet Take 1,000 mg by mouth 2 (two) times daily.  11  . metoprolol tartrate (LOPRESSOR) 50 MG tablet Take 1 tablet (50 mg total) by mouth 2 (two) times daily. 180 tablet 3  . Multiple Vitamin (MULTIVITAMIN) tablet Take 1 tablet by mouth daily.    . nitroGLYCERIN (NITROSTAT) 0.4 MG SL tablet Place 1 tablet (0.4 mg total) under the tongue every 5 (five) minutes x 3 doses as needed for chest pain. 25 tablet 4  . traZODone (DESYREL) 100 MG tablet Take 100 mg by mouth at bedtime.    . Insulin Degludec-Liraglutide (XULTOPHY) 100-3.6 UNIT-MG/ML SOPN Inject 50 Units into the skin daily.    . nicotine (NICODERM CQ - DOSED IN MG/24 HOURS) 14 mg/24hr patch Place 1 patch (14 mg total) onto the skin daily. (Patient not taking: Reported on 01/23/2020) 28 patch 0   No current facility-administered medications for this visit.    LABS/IMAGING: No results found for this or any previous visit (from the past 48 hour(s)). No results found.  VITALS: There were no vitals taken for this visit.  EXAM: General appearance: alert and no distress Neck: no carotid bruit and no JVD Lungs: clear to auscultation bilaterally Heart: regular rate and rhythm, S1, S2 normal, no murmur, click, rub or gallop Abdomen: soft, non-tender; bowel sounds normal; no masses,  no  organomegaly Extremities: extremities normal, atraumatic, no cyanosis or edema Pulses: 2+ and symmetric Skin: Skin color, texture, turgor normal. No rashes or lesions Neurologic: Grossly  normal Psych: Pleasant, does not appear depressed or manic today  EKG: Normal sinus rhythm at 84-personally reviewed  ASSESSMENT: 1. CAD status post drug-eluting stent to the right coronary for ST elevation MI in 03/2014 2. Hypertension 3. 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 Tobacco abuse 5. Bipolar disorder 6. Type 2 diabetes  PLAN: 1.   Albert Villegas remains asymptomatic.  Blood pressure is elevated today and he is taking his medicines.  I encouraged him to purchase a blood pressure cuff and take home readings.  He will contact me with those results and I may adjust his medications.  He recently started on Repatha.  He is now had 4 doses and I would like to repeat a lipid profile.  Adjust medications accordingly.  Plan follow-up with me in 6 months or sooner as necessary.  Chrystie Nose, MD, [facility], FACP  [place]  [facility]  Medical Director of the Advanced Lipid Disorders &  Cardiovascular Risk Reduction Clinic Diplomate of the American Board of Clinical Lipidology Attending Cardiologist  Direct Dial: [phone]  Fax: [phone]  Website:  [email or web address] 01/23/2020, 3:39 PM

## 2020-01-23 NOTE — Patient Instructions (Signed)
Medication Instructions:  Your physician recommends that you continue on your current medications as directed. Please refer to the Current Medication list given to you today.  *If you need a refill on your cardiac medications before your next appointment, please call your pharmacy*   Lab Work: FASTING lab work to check cholesterol  -- can be completed at any [facility]  If you have labs (blood work) drawn today and your tests are completely normal, you will receive your results only by: Marland Kitchen MyChart Message (if you have MyChart) OR . A paper copy in the mail If you have any lab test that is abnormal or we need to change your treatment, we will call you to review the results.   Testing/Procedures: NONE   Follow-Up: At [facility], you and your health needs are our priority.  As part of our continuing mission to provide you with exceptional heart care, we have created designated Provider Care Teams.  These Care Teams include your primary Cardiologist (physician) and Advanced Practice Providers (APPs -  Physician Assistants and Nurse Practitioners) who all work together to provide you with the care you need, when you need it.  We recommend signing up for the patient portal called "MyChart".  Sign up information is provided on this After Visit Summary.  MyChart is used to connect with patients for Virtual Visits (Telemedicine).  Patients are able to view lab/test results, encounter notes, upcoming appointments, etc.  Non-urgent messages can be sent to your provider as well.   To learn more about what you can do with MyChart, go to [email or web address].    Your next appointment:   6 month(s)  The format for your next appointment:   In Person  Provider:   Dr. Zoila Shutter   Other Instructions  OMRON (arm) blood pressure cuff Please check BP at home and send 2-3 weeks of readings to Dr. Rennis Golden via MyChart

## 2020-08-25 ENCOUNTER — Telehealth: Payer: Self-pay | Admitting: Internal Medicine

## 2020-08-25 NOTE — Telephone Encounter (Signed)
vascepa PA submitted via CMM - (Key: [id number])  Labs 02/2020 from [facility] Total chol: 84 HDL: 36 LDL: 11 Trig: 254

## 2020-08-25 NOTE — Telephone Encounter (Signed)
Vascepa approved: Request Reference Number: [id number]. ICOSAPENT CAP 1GM is approved through 08/25/2021. Your patient may now fill this prescription and it will be covered.

## 2020-08-26 NOTE — Telephone Encounter (Addendum)
PA for repatha submitted via CMM (Key: [id number])  Request Reference Number: [id number]. REPATHA SURE INJ 140MG /ML is approved through 08/25/2021

## 2020-10-21 ENCOUNTER — Encounter: Payer: Self-pay | Admitting: Internal Medicine

## 2020-10-21 ENCOUNTER — Ambulatory Visit: Payer: Commercial Managed Care - PPO | Admitting: Internal Medicine

## 2020-10-21 ENCOUNTER — Other Ambulatory Visit: Payer: Self-pay

## 2020-10-21 VITALS — BP 124/78 | HR 107 | Ht 69.0 in | Wt 236.4 lb

## 2020-10-21 DIAGNOSIS — I10 Essential (primary) hypertension: Secondary | ICD-10-CM | POA: Diagnosis not present

## 2020-10-21 DIAGNOSIS — Z955 Presence of coronary angioplasty implant and graft: Secondary | ICD-10-CM

## 2020-10-21 DIAGNOSIS — E7801 Familial hypercholesterolemia: Secondary | ICD-10-CM

## 2020-10-21 NOTE — Patient Instructions (Signed)
Medication Instructions:  Continue current medications  *If you need a refill on your cardiac medications before your next appointment, please call your pharmacy*   Lab Work: None Ordered   Testing/Procedures: None Ordered   Follow-Up: At [facility], you and your health needs are our priority.  As part of our continuing mission to provide you with exceptional heart care, we have created designated Provider Care Teams.  These Care Teams include your primary Cardiologist (physician) and Advanced Practice Providers (APPs -  Physician Assistants and Nurse Practitioners) who all work together to provide you with the care you need, when you need it.  We recommend signing up for the patient portal called "MyChart".  Sign up information is provided on this After Visit Summary.  MyChart is used to connect with patients for Virtual Visits (Telemedicine).  Patients are able to view lab/test results, encounter notes, upcoming appointments, etc.  Non-urgent messages can be sent to your provider as well.   To learn more about what you can do with MyChart, go to [email or web address].    Your next appointment:   1 year(s)  The format for your next appointment:   In Person  Provider:   You may see Kenneth Hilty, MD or one of the following Advanced Practice Providers on your designated Care Team:    Hao Meng, PA-C  Angela Duke, PA-C or   Krista Kroeger, PA-C     

## 2020-10-21 NOTE — Progress Notes (Unsigned)
OFFICE NOTE  Chief Complaint:  Follow-up dyslipidemia  Primary Care Physician: Martha Clan, MD  HPI:  Albert Villegas is a 50 year old moderately obese male with a history of GERD, tobacco abuse, treated type 2 diabetes mellitus and hypertension who presented to [facility] on 03/22/2014 with complaints of chest pain started about 45 minutes prior to presentation. He had radiation of pain down his left arm and also noted dizziness, nausea and vomiting. No diaphoresis. EKG demonstrated 2 mm ST elevations inferiorly with depressions in V1 and V2. Code STEMI was activated and he was transported urgently to the cath lab for urgent coronary catheterization. This was performed by Dr. Excell Seltzer. He was found to have a complex stenosis of the mid to distal right coronary artery. This was successfully treated with PCI utilizing aspiration thrombectomy and a drug-eluting stent. He was also noted to have minor nonobstructive disease involving the LAD and left circumflex. A 2-D echocardiogram was obtained post cath demonstrating mild systolic dysfunction with an estimated ejection fraction of 45% with diffuse hypokinesis, worse in the septum and apex. He was placed on dual antiplatelet therapy with aspirin and Effient along with metoprolol, Imdur and Lipitor. Zetia and fenofibrate were also added to better treat his hyperlipidemia. Triglycerides were severely elevated at 727 mg/dL. LDL was not calculated due to triglyceride levels over 400. ACE/ARB therapy was not initiated due to borderline hypotension. Of note, his Hgb A1c was also elevated at 7.6. His metformin was restarted 48 hours post-cath. He was discharged home on 03/24/2014.  He was seen in followup a few weeks ago and was doing fairly well. No adjustments were made to his medications. He's had significant weight loss. In general he's done well with smoking cessation but occasionally he does smoke some cigarettes. He is requesting nicotine  replacement today. He denies any further chest pain. He is now back to work. He is getting some mild bruising with normal activities, probably related to his dual antiplatelet therapy.  I saw Nial back in the office today for follow-up. Overall he is without complaints. He denies any chest pain or worsening shortness of breath. At his last office visit we talked about coming off of Effient. It's been more than a year since he had a stent placed and I think we can effectively do that today. I also think he would do well without isosorbide. He denies any chest pain.  08/02/2016  Jas returns today for follow-up. It's been a year since I saw him. Unfortunately he's had no cardiovascular events over the past year. He had recent blood work from his primary care provider which showed total cholesterol 193 and LDL 111. Triglycerides were 265, but known to be greater than 1000 while not on a fibrate. His hemoglobin A1c is 7.5. He has managed to lose about 8 pounds since last year but he says currently it's plateaued. He denies any worsening shortness of breath or chest pain with exertion. Given his early onset coronary disease and significant dyslipidemia suspect he has FH. Untreated cholesterol of 318. Dutch score of 8.  06/13/2018  Srihaan returns today for follow-up.  Overall he says he feels well and denies chest pain or worsening shortness of breath.  As suspected he is likely to have FH.  He is most recent lipid profile showed total cholesterol 204, triglycerides 223, HDL 30 and LDL 129.  This is on high intensity atorvastatin 80 mg, Zetia 10 mg and fenofibrate 160 mg daily, along with Vascepa  2 g twice daily.  Previously he had been on Praluent however it was not cost effective once he lost his job.  Now he has a new job and has reestablished insurance with [employer].  His PCP has recommended Repatha and I would agree with this as it is more likely to be covered.  He does need additional therapy to reach  target LDL.  Finally, I recommended that he have his sunscreen for dyslipidemia.  He is currently 50 years old, but if he inherited a genetic mutation it is possible that he may already have elevated LDL cholesterol.  Genetic testing is a possibility however it may be cost effective at this point.   10/24/2019  Grayden is seen today in follow-up.  He has been recently difficult to get a hold of.  We wanted to repeat labs.  He did have some labs through his PCP although did a virtual visit.  His total cholesterol in May was 192, triglycerides were up at 293, HDL 34 and LDL 99.  His target LDL is less than 70, but we have had a difficult time in achieving that.  He would really benefit from a PCSK9 inhibitor, but says he is received 2 letters of denial from Praluent.  We previously discussed Repatha but it does not seem we been able to pursue that.  Diabetes is also not well controlled with hemoglobin A1c of 8.5.  He denies any worsening chest pain or recurrent symptoms.  He does report some mild numbness in the left arm which is not associated with chest pain and comes and goes.  01/23/2020  Berley returns today for follow-up.  Unfortunately was originally denied Praluent, but then approved for Repatha.  He just started taking the medicine probably 2 months ago and has had 4 doses.  He has not had repeat lipids.  Blood pressure is elevated today.  He says he does not follow it regularly at home.  He does not own a blood pressure cuff and I encouraged him to purchase one.  He denies any chest pain or shortness of breath.  A repeat EKG was performed today as his prior EKG showed some new inferior T wave inversions.  Those have resolved and his EKG shows sinus rhythm today at 84.  10/21/2020  Aristidis returns today for follow-up.  Overall he says he is doing well.  He denies chest pain or shortness of breath.  He has had no episodes of pancreatitis.  He reports compliance with his medication regimen.  Unfortunately he  was recently denied Repatha.  He had been doing very well with an LDL of 11 back in June 2021.  His PCP is trying to work on reestablishing that.  He has not had recent lipid testing however plans to have it redrawn.  PMHx:  Past Medical History:  Diagnosis Date  . Acute renal failure (HCC) 03/22/14   dehydration resolved  . Depression   . DM2 (diabetes mellitus, type 2) (HCC)   . GERD (gastroesophageal reflux disease)   . Hyperlipidemia LDL goal <70   . Sleep apnea in adult   . Sprain and strain of cruciate ligament of knee 02/2012   left ACL  . ST elevation myocardial infarction (STEMI) of inferior wall (HCC) 03/22/14   stent DES to RCA  . Tear of medial cartilage or meniscus of knee, current 02/2012   left  . Tobacco use 03/24/2014    Past Surgical History:  Procedure Laterality Date  . CORONARY  ANGIOPLASTY WITH STENT PLACEMENT  03/22/14   emergent DES to RCA with thrombectomy  . KNEE LIGAMENT RECONSTRUCTION    . LEFT HEART CATHETERIZATION WITH CORONARY ANGIOGRAM N/A 03/22/2014   Procedure: LEFT HEART CATHETERIZATION WITH CORONARY ANGIOGRAM;  Surgeon: Micheline Chapman, MD;  Location: [facility] CATH LAB;  Service: Cardiovascular;  Laterality: N/A;  . REDUCTION OF TORSION OF TESTIS  age 42    FAMHx:  Family History  Problem Relation Age of Onset  . Hyperlipidemia Other   . Hypertension Other   . Heart attack Maternal Grandfather   . Heart failure Paternal Grandfather     SOCHx:   reports that he quit smoking about 6 years ago. His smoking use included cigarettes. He has a 15.00 pack-year smoking history. He has never used smokeless tobacco. He reports current alcohol use. He reports that he does not use drugs.  ALLERGIES:  No Known Allergies  ROS: Pertinent items noted in HPI and remainder of comprehensive ROS otherwise negative.  HOME MEDS: Current Outpatient Medications  Medication Sig Dispense Refill  . amLODipine (NORVASC) 5 MG tablet Take 1 tablet (5 mg total) by mouth  daily. 90 tablet 3  . amphetamine-dextroamphetamine (ADDERALL) 20 MG tablet Take 30 mg by mouth daily.     . ARIPiprazole (ABILIFY) 10 MG tablet Take 5 mg by mouth daily.     Marland Kitchen aspirin EC 81 MG tablet Take 81 mg by mouth daily.    Marland Kitchen atorvastatin (LIPITOR) 80 MG tablet Take 1 tablet (80 mg total) by mouth daily. 90 tablet 3  . desvenlafaxine (PRISTIQ) 100 MG 24 hr tablet Take 50 mg by mouth every morning.    . diclofenac (CATAFLAM) 50 MG tablet Take 50 mg by mouth 2 (two) times daily as needed.    . empagliflozin (JARDIANCE) 25 MG TABS tablet Take 25 mg by mouth daily.    . fenofibrate 160 MG tablet Take 1 tablet (160 mg total) by mouth daily. 90 tablet 3  . icosapent Ethyl (VASCEPA) 1 g capsule Take 2 capsules (2 g total) by mouth 2 (two) times daily. Takes 2 tablets in the morning and 2 at night 360 capsule 3  . losartan (COZAAR) 50 MG tablet Take 1 tablet (50 mg total) by mouth daily. 90 tablet 3  . metFORMIN (GLUCOPHAGE) 1000 MG tablet Take 1,000 mg by mouth 2 (two) times daily.  11  . metoprolol tartrate (LOPRESSOR) 50 MG tablet Take 1 tablet (50 mg total) by mouth 2 (two) times daily. 180 tablet 3  . Multiple Vitamin (MULTIVITAMIN) tablet Take 1 tablet by mouth daily.    . nitroGLYCERIN (NITROSTAT) 0.4 MG SL tablet Place 1 tablet (0.4 mg total) under the tongue every 5 (five) minutes x 3 doses as needed for chest pain. 25 tablet 4  . Semaglutide, 1 MG/DOSE, (OZEMPIC, 1 MG/DOSE,) 4 MG/3ML SOPN Inject 1 mg into the skin once a week.    . traZODone (DESYREL) 100 MG tablet Take 100 mg by mouth at bedtime.     No current facility-administered medications for this visit.    LABS/IMAGING: No results found for this or any previous visit (from the past 48 hour(s)). No results found.  VITALS: BP 124/78   Pulse (!) 107   Ht 5\' 9"  (1.753 m)   Wt 236 lb 6.4 oz (107.2 kg)   BMI 34.91 kg/m   EXAM: General appearance: alert and no distress Neck: no carotid bruit and no JVD Lungs: clear to  auscultation bilaterally Heart: regular  rate and rhythm, S1, S2 normal, no murmur, click, rub or gallop Abdomen: soft, non-tender; bowel sounds normal; no masses,  no organomegaly Extremities: extremities normal, atraumatic, no cyanosis or edema Pulses: 2+ and symmetric Skin: Skin color, texture, turgor normal. No rashes or lesions Neurologic: Grossly normal Psych: Pleasant, does not appear depressed or manic today  EKG: Sinus tachycardia at 107, possible left atrial margin-personally reviewed  ASSESSMENT: 1. CAD status post drug-eluting stent to the right coronary for ST elevation MI in 03/2014 2. Hypertension 3. 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 Tobacco abuse 5. Bipolar disorder 6. Type 2 diabetes  PLAN: 1.   Mr. Gatley had an excellent response to PCSK9 inhibitors with an LDL eventually of 11 however this has been denied for ongoing use by insurance.  He should remain on his current doses of statin.  He denies any chest pain.  Blood pressure appears well controlled today.  No medication changes are recommended.  Follow-up with me annually or sooner as necessary.  Chrystie Nose, MD, [facility], FACP  [place]  [facility]  Medical Director of the [facility] Lipid Disorders &  Cardiovascular Risk Reduction Clinic Diplomate of the American Board of Clinical Lipidology Attending Cardiologist  Direct Dial: [phone]  Fax: [phone]  Website:  [email or web address]..[place] 10/21/2020, 4:48 PM

## 2021-03-11 ENCOUNTER — Telehealth: Payer: Self-pay | Admitting: Internal Medicine

## 2021-03-11 MED ORDER — REPATHA SURECLICK 140 MG/ML ~~LOC~~ SOAJ: 1.0000 | SUBCUTANEOUS | 3 refills | Status: DC

## 2021-03-11 NOTE — Telephone Encounter (Signed)
RE: needs PCSK9 Received: Delorse Lek, Lisette Abu, MD  Lindell Spar, RN I think he rechecked his cholesterol - I see where it says it was approved back in December, but he told me he had been denied in February - sounds like he needs to reach out to his pharmacy and try to have the Rx filled and start taking it.            Previous Messages    ----- Message -----  From: Lindell Spar, RN  Sent: 03/10/2021   2:01 PM EDT  To: Chrystie Nose, MD  Subject: RE: needs PCSK9                                 Unless he changed insurance, of which patient did not notify me of, he has an approval until 08/2021 per my notes. Do you know if PCP checked a lipid, cause the last we have is from 2021   ----- Message -----  From: Chrystie Nose, MD  Sent: 03/10/2021   9:57 AM EDT  To: Lindell Spar, RN  Subject: needs PCSK9                                     Message from PCP that LDL still 127, despite high dose lipitor - has been denied PCSK9 - wanted to see if we could get him back in and get him approved. I don't see any scheduled appts   Thanks.   Dr. Rexene Edison

## 2021-03-11 NOTE — Telephone Encounter (Signed)
Contacted patient in regards to staff message communication below. Explained that Repatha was approved in 08/2020 until 08/25/2021. He said he has not had Repatha in forever. Rx(s) sent to pharmacy electronically and asked him to call next week if he has issues getting medication

## 2021-03-12 ENCOUNTER — Telehealth: Payer: Self-pay | Admitting: Internal Medicine

## 2021-03-12 MED ORDER — LOSARTAN POTASSIUM 50 MG PO TABS: 50.0000 mg | ORAL_TABLET | Freq: Every day | ORAL | 2 refills | Status: DC

## 2021-03-12 NOTE — Telephone Encounter (Signed)
*  STAT* If patient is at the pharmacy, call can be transferred to refill team.   1. Which medications need to be refilled? (please list name of each medication and dose if known)  losartan (COZAAR) 50 MG tablet fenofibrate 160 MG tablet  2. Which pharmacy/location (including street and city if local pharmacy) is medication to be sent to? [facility] [id number] - [place], [place] - [address]  3. Do they need a 30 day or 90 day supply? 90 day supply

## 2021-07-30 ENCOUNTER — Telehealth: Payer: Self-pay | Admitting: Internal Medicine

## 2021-07-30 NOTE — Telephone Encounter (Signed)
Request Reference Number: [id number]. REPATHA SURE INJ 140MG /ML is approved through 07/30/2022

## 2021-07-30 NOTE — Telephone Encounter (Signed)
PA for Repatha [employer] submitted via CMM (Key: [id number])

## 2021-11-11 ENCOUNTER — Encounter: Payer: Self-pay | Admitting: Internal Medicine

## 2021-11-11 ENCOUNTER — Other Ambulatory Visit: Payer: Self-pay

## 2021-11-11 ENCOUNTER — Ambulatory Visit: Payer: Commercial Managed Care - PPO | Admitting: Internal Medicine

## 2021-11-11 VITALS — BP 120/64 | HR 77 | Ht 69.0 in | Wt 224.4 lb

## 2021-11-11 DIAGNOSIS — I10 Essential (primary) hypertension: Secondary | ICD-10-CM

## 2021-11-11 DIAGNOSIS — E7801 Familial hypercholesterolemia: Secondary | ICD-10-CM | POA: Diagnosis not present

## 2021-11-11 DIAGNOSIS — Z955 Presence of coronary angioplasty implant and graft: Secondary | ICD-10-CM

## 2021-11-11 DIAGNOSIS — E119 Type 2 diabetes mellitus without complications: Secondary | ICD-10-CM | POA: Diagnosis not present

## 2021-11-11 LAB — LIPID PANEL
Chol/HDL Ratio: 3.5 ratio (ref 0.0–5.0)
Cholesterol, Total: 104 mg/dL (ref 100–199)
HDL: 30 mg/dL — ABNORMAL LOW (ref >39)
LDL Chol Calc (NIH): 40 mg/dL (ref 0–99)
Triglycerides: 215 mg/dL — ABNORMAL HIGH (ref 0–149)
VLDL Cholesterol Cal: 34 mg/dL (ref 5–40)

## 2021-11-11 MED ORDER — AMLODIPINE BESYLATE 5 MG PO TABS: 5.0000 mg | ORAL_TABLET | Freq: Every day | ORAL | 3 refills | Status: DC

## 2021-11-11 NOTE — Patient Instructions (Signed)
Medication Instructions:  ?Continue current medications ? ?*If you need a refill on your cardiac medications before your next appointment, please call your pharmacy* ? ? ?Lab Work: ?Lipid Panel today  ? ?If you have labs (blood work) drawn today and your tests are completely normal, you will receive your results only by: ?MyChart Message (if you have MyChart) OR ?A paper copy in the mail ?If you have any lab test that is abnormal or we need to change your treatment, we will call you to review the results. ? ? ?Testing/Procedures: ?NONE ? ? ?Follow-Up: ?At [facility], you and your health needs are our priority.  As part of our continuing mission to provide you with exceptional heart care, we have created designated Provider Care Teams.  These Care Teams include your primary Cardiologist (physician) and Advanced Practice Providers (APPs -  Physician Assistants and Nurse Practitioners) who all work together to provide you with the care you need, when you need it. ? ?We recommend signing up for the patient portal called "MyChart".  Sign up information is provided on this After Visit Summary.  MyChart is used to connect with patients for Virtual Visits (Telemedicine).  Patients are able to view lab/test results, encounter notes, upcoming appointments, etc.  Non-urgent messages can be sent to your provider as well.   ?To learn more about what you can do with MyChart, go to [email or web address].   ? ?Your next appointment:   ?1 year with Dr. Debara Pickett  ? ?

## 2021-11-11 NOTE — Progress Notes (Signed)
OFFICE NOTE  Chief Complaint:  Follow-up  Primary Care Physician: Cleatis Polka., MD  HPI:  Albert Villegas is a 51 year old moderately obese male with a history of GERD, tobacco abuse, treated type 2 diabetes mellitus and hypertension who presented to [facility] on 03/22/2014 with complaints of chest pain started about 45 minutes prior to presentation. He had radiation of pain down his left arm and also noted dizziness, nausea and vomiting. No diaphoresis. EKG demonstrated 2 mm ST elevations inferiorly with depressions in V1 and V2. Code STEMI was activated and he was transported urgently to the cath lab for urgent coronary catheterization. This was performed by Dr. Excell Seltzer. He was found to have a complex stenosis of the mid to distal right coronary artery. This was successfully treated with PCI utilizing aspiration thrombectomy and a drug-eluting stent. He was also noted to have minor nonobstructive disease involving the LAD and left circumflex. A 2-D echocardiogram was obtained post cath demonstrating mild systolic dysfunction with an estimated ejection fraction of 45% with diffuse hypokinesis, worse in the septum and apex. He was placed on dual antiplatelet therapy with aspirin and Effient along with metoprolol, Imdur and Lipitor. Zetia and fenofibrate were also added to better treat his hyperlipidemia. Triglycerides were severely elevated at 727 mg/dL. LDL was not calculated due to triglyceride levels over 400. ACE/ARB therapy was not initiated due to borderline hypotension. Of note, his Hgb A1c was also elevated at 7.6. His metformin was restarted 48 hours post-cath. He was discharged home on 03/24/2014.  He was seen in followup a few weeks ago and was doing fairly well. No adjustments were made to his medications. He's had significant weight loss. In general he's done well with smoking cessation but occasionally he does smoke some cigarettes. He is requesting nicotine  replacement today. He denies any further chest pain. He is now back to work. He is getting some mild bruising with normal activities, probably related to his dual antiplatelet therapy.  I saw Shalamar back in the office today for follow-up. Overall he is without complaints. He denies any chest pain or worsening shortness of breath. At his last office visit we talked about coming off of Effient. It's been more than a year since he had a stent placed and I think we can effectively do that today. I also think he would do well without isosorbide. He denies any chest pain.  08/02/2016  Jakub returns today for follow-up. It's been a year since I saw him. Unfortunately he's had no cardiovascular events over the past year. He had recent blood work from his primary care provider which showed total cholesterol 193 and LDL 111. Triglycerides were 265, but known to be greater than 1000 while not on a fibrate. His hemoglobin A1c is 7.5. He has managed to lose about 8 pounds since last year but he says currently it's plateaued. He denies any worsening shortness of breath or chest pain with exertion. Given his early onset coronary disease and significant dyslipidemia suspect he has FH. Untreated cholesterol of 318. Dutch score of 8.  06/13/2018  Travontae returns today for follow-up.  Overall he says he feels well and denies chest pain or worsening shortness of breath.  As suspected he is likely to have FH.  He is most recent lipid profile showed total cholesterol 204, triglycerides 223, HDL 30 and LDL 129.  This is on high intensity atorvastatin 80 mg, Zetia 10 mg and fenofibrate 160 mg daily, along with  Vascepa 2 g twice daily.  Previously he had been on Praluent however it was not cost effective once he lost his job.  Now he has a new job and has reestablished insurance with [employer].  His PCP has recommended Repatha and I would agree with this as it is more likely to be covered.  He does need additional therapy to reach  target LDL.  Finally, I recommended that he have his sunscreen for dyslipidemia.  He is currently 51 years old, but if he inherited a genetic mutation it is possible that he may already have elevated LDL cholesterol.  Genetic testing is a possibility however it may be cost effective at this point.   10/24/2019  Harout is seen today in follow-up.  He has been recently difficult to get a hold of.  We wanted to repeat labs.  He did have some labs through his PCP although did a virtual visit.  His total cholesterol in May was 192, triglycerides were up at 293, HDL 34 and LDL 99.  His target LDL is less than 70, but we have had a difficult time in achieving that.  He would really benefit from a PCSK9 inhibitor, but says he is received 2 letters of denial from Praluent.  We previously discussed Repatha but it does not seem we been able to pursue that.  Diabetes is also not well controlled with hemoglobin A1c of 8.5.  He denies any worsening chest pain or recurrent symptoms.  He does report some mild numbness in the left arm which is not associated with chest pain and comes and goes.  01/23/2020  Itay returns today for follow-up.  Unfortunately was originally denied Praluent, but then approved for Repatha.  He just started taking the medicine probably 2 months ago and has had 4 doses.  He has not had repeat lipids.  Blood pressure is elevated today.  He says he does not follow it regularly at home.  He does not own a blood pressure cuff and I encouraged him to purchase one.  He denies any chest pain or shortness of breath.  A repeat EKG was performed today as his prior EKG showed some new inferior T wave inversions.  Those have resolved and his EKG shows sinus rhythm today at 84.  10/21/2020  Herrick returns today for follow-up.  Overall he says he is doing well.  He denies chest pain or shortness of breath.  He has had no episodes of pancreatitis.  He reports compliance with his medication regimen.  Unfortunately he  was recently denied Repatha.  He had been doing very well with an LDL of 11 back in June 2021.  His PCP is trying to work on reestablishing that.  He has not had recent lipid testing however plans to have it redrawn.  11/11/2021  Elijah is seen today in follow-up.  He continues to do well.  He is back on Repatha after some issues with his insurance.  Has not had repeat lipids since then but had a very low LDL in the past.  This had gone up to 121 off of medication last summer.  He denies any chest pain or worsening shortness of breath.  He switched from Ozempic to [facility] and seems to be doing well with this.  He had lost about 22 pounds but gained about 10 back.  I have encouraged continued exercise.  PMHx:  Past Medical History:  Diagnosis Date   Acute renal failure (HCC) 03/22/14   dehydration  resolved   Depression    DM2 (diabetes mellitus, type 2) (HCC)    GERD (gastroesophageal reflux disease)    Hyperlipidemia LDL goal <70    Sleep apnea in adult    Sprain and strain of cruciate ligament of knee 02/2012   left ACL   ST elevation myocardial infarction (STEMI) of inferior wall (HCC) 03/22/14   stent DES to RCA   Tear of medial cartilage or meniscus of knee, current 02/2012   left   Tobacco use 03/24/2014    Past Surgical History:  Procedure Laterality Date   CORONARY ANGIOPLASTY WITH STENT PLACEMENT  03/22/14   emergent DES to RCA with thrombectomy   KNEE LIGAMENT RECONSTRUCTION     LEFT HEART CATHETERIZATION WITH CORONARY ANGIOGRAM N/A 03/22/2014   Procedure: LEFT HEART CATHETERIZATION WITH CORONARY ANGIOGRAM;  Surgeon: Micheline Chapman, MD;  Location: [facility] C[facility] LAB;  Service: Cardiovascular;  Laterality: N/A;   REDUCTION OF TORSION OF TESTIS  age 59    FAMHx:  Family History  Problem Relation Age of Onset   Hyperlipidemia Other    Hypertension Other    Heart attack Maternal Grandfather    Heart failure Paternal Grandfather     SOCHx:   reports that he quit smoking about 7  years ago. His smoking use included cigarettes. He has a 15.00 pack-year smoking history. He has never used smokeless tobacco. He reports current alcohol use. He reports that he does not use drugs.  ALLERGIES:  No Known Allergies  ROS: Pertinent items noted in HPI and remainder of comprehensive ROS otherwise negative.  HOME MEDS: Current Outpatient Medications  Medication Sig Dispense Refill   amLODipine (NORVASC) 5 MG tablet Take 1 tablet (5 mg total) by mouth daily. 90 tablet 3   amphetamine-dextroamphetamine (ADDERALL) 20 MG tablet Take 30 mg by mouth daily.      ARIPiprazole (ABILIFY) 10 MG tablet Take 5 mg by mouth daily.      aspirin EC 81 MG tablet Take 81 mg by mouth daily.     atorvastatin (LIPITOR) 80 MG tablet Take 1 tablet (80 mg total) by mouth daily. 90 tablet 3   cyclobenzaprine (FLEXERIL) 10 MG tablet Take 10 mg by mouth 3 (three) times daily.     desvenlafaxine (PRISTIQ) 100 MG 24 hr tablet Take 50 mg by mouth every morning.     diclofenac (CATAFLAM) 50 MG tablet Take 50 mg by mouth 2 (two) times daily as needed.     empagliflozin (JARDIANCE) 25 MG TABS tablet Take 25 mg by mouth daily.     Evolocumab (REPATHA SURECLICK) 140 MG/ML SOAJ Inject 1 Dose into the skin every 14 (fourteen) days. 6 mL 3   fenofibrate 160 MG tablet Take 1 tablet (160 mg total) by mouth daily. 90 tablet 3   FLUoxetine (PROZAC) 10 MG capsule Take 10 mg by mouth daily.     icosapent Ethyl (VASCEPA) 1 g capsule Take 2 capsules (2 g total) by mouth 2 (two) times daily. Takes 2 tablets in the morning and 2 at night 360 capsule 3   losartan (COZAAR) 50 MG tablet Take 1 tablet (50 mg total) by mouth daily. 90 tablet 2   metFORMIN (GLUCOPHAGE) 1000 MG tablet Take 1,000 mg by mouth 2 (two) times daily.  11   metoprolol tartrate (LOPRESSOR) 50 MG tablet Take 1 tablet (50 mg total) by mouth 2 (two) times daily. 180 tablet 3   MOUNJARO 10 MG/0.5ML Pen Inject into the skin.  Multiple Vitamin (MULTIVITAMIN)  tablet Take 1 tablet by mouth daily.     naproxen (NAPROSYN) 500 MG tablet Take 500 mg by mouth 2 (two) times daily.     nitroGLYCERIN (NITROSTAT) 0.4 MG SL tablet Place 1 tablet (0.4 mg total) under the tongue every 5 (five) minutes x 3 doses as needed for chest pain. 25 tablet 4   traZODone (DESYREL) 100 MG tablet Take 100 mg by mouth at bedtime.     Semaglutide, 1 MG/DOSE, (OZEMPIC, 1 MG/DOSE,) 4 MG/3ML SOPN Inject 1 mg into the skin once a week. (Patient not taking: Reported on 11/11/2021)     No current facility-administered medications for this visit.    LABS/IMAGING: No results found for this or any previous visit (from the past 48 hour(s)). No results found.  VITALS: BP 120/64    Pulse 77    Ht 5\' 9"  (1.753 m)    Wt 224 lb 6.4 oz (101.8 kg)    SpO2 96%    BMI 33.14 kg/m   EXAM: General appearance: alert and no distress Neck: no carotid bruit and no JVD Lungs: clear to auscultation bilaterally Heart: regular rate and rhythm, S1, S2 normal, no murmur, click, rub or gallop Abdomen: soft, non-tender; bowel sounds normal; no masses,  no organomegaly Extremities: extremities normal, atraumatic, no cyanosis or edema Pulses: 2+ and symmetric Skin: Skin color, texture, turgor normal. No rashes or lesions Neurologic: Grossly normal Psych: Pleasant, does not appear depressed or manic today  EKG: Normal sinus rhythm at 77-personally reviewed  ASSESSMENT: CAD status post drug-eluting stent to the right coronary for ST elevation MI in 03/2014 Hypertension Familial hypercholesterolemia - Dutch score of 8 Former smoker Bipolar disorder Type 2 diabetes  PLAN: 1.   Mr. Habicht seems to be doing well on complement of medications.  He is due for repeat lipids now after reestablishing Repatha since there was an insurance issue.  We will check those today.  We provided a refill of his amlodipine.  Blood pressure is well controlled.  EKG is normal and he is asymptomatic.  Plan follow-up with  me annually or sooner as necessary.  Chrystie Nose, MD, [facility], FACP  [place]   [facility]  Medical Director of the [facility]  [facility] Diplomate of the American Board of Clinical Lipidology Attending Cardiologist  Direct Dial: [phone]   Fax: [phone]  Website:  [email or web address]   Lisette Abu Jolynne Spurgin 11/11/2021, 8:21 AM

## 2022-03-22 ENCOUNTER — Other Ambulatory Visit: Payer: Self-pay | Admitting: Internal Medicine

## 2022-03-22 NOTE — Telephone Encounter (Signed)
Rx(s) sent to pharmacy electronically.  

## 2022-04-25 ENCOUNTER — Other Ambulatory Visit: Payer: Self-pay | Admitting: Internal Medicine

## 2022-07-14 ENCOUNTER — Telehealth: Payer: Self-pay | Admitting: Internal Medicine

## 2022-07-14 NOTE — Telephone Encounter (Signed)
PA for Repatha submitted via CMM (Key: [id number])

## 2022-09-14 ENCOUNTER — Other Ambulatory Visit: Payer: Self-pay | Admitting: Internal Medicine

## 2022-09-14 DIAGNOSIS — E7801 Familial hypercholesterolemia: Secondary | ICD-10-CM

## 2022-10-28 LAB — LIPID PANEL
Chol/HDL Ratio: 1.8 ratio (ref 0.0–5.0)
Cholesterol, Total: 74 mg/dL — ABNORMAL LOW (ref 100–199)
HDL: 41 mg/dL (ref >39)
LDL Chol Calc (NIH): 2 mg/dL (ref 0–99)
Triglycerides: 204 mg/dL — ABNORMAL HIGH (ref 0–149)
VLDL Cholesterol Cal: 31 mg/dL (ref 5–40)

## 2022-11-03 ENCOUNTER — Ambulatory Visit: Payer: Commercial Managed Care - PPO | Admitting: Internal Medicine

## 2022-12-12 ENCOUNTER — Encounter: Payer: Self-pay | Admitting: Internal Medicine

## 2022-12-12 ENCOUNTER — Ambulatory Visit: Payer: Commercial Managed Care - PPO | Attending: Internal Medicine | Admitting: Internal Medicine

## 2022-12-12 VITALS — BP 120/72 | HR 94 | Ht 69.0 in | Wt 235.8 lb

## 2022-12-12 DIAGNOSIS — E7801 Familial hypercholesterolemia: Secondary | ICD-10-CM

## 2022-12-12 DIAGNOSIS — Z955 Presence of coronary angioplasty implant and graft: Secondary | ICD-10-CM

## 2022-12-12 MED ORDER — ATORVASTATIN CALCIUM 80 MG PO TABS: 80.0000 mg | ORAL_TABLET | Freq: Every day | ORAL | 3 refills | Status: DC

## 2022-12-12 MED ORDER — REPATHA SURECLICK 140 MG/ML ~~LOC~~ SOAJ: SUBCUTANEOUS | 3 refills | Status: DC

## 2022-12-12 MED ORDER — FENOFIBRATE 160 MG PO TABS: 160.0000 mg | ORAL_TABLET | Freq: Every day | ORAL | 3 refills | Status: DC

## 2022-12-12 MED ORDER — ICOSAPENT ETHYL 1 G PO CAPS: 2.0000 g | ORAL_CAPSULE | Freq: Two times a day (BID) | ORAL | 3 refills | Status: DC

## 2022-12-12 NOTE — Progress Notes (Signed)
OFFICE NOTE  Chief Complaint:  Follow-up  Primary Care Physician: Ginger Organ., MD  HPI:  Albert Villegas is a 52 year old moderately obese male with a history of GERD, tobacco abuse, treated type 2 diabetes mellitus and hypertension who presented to [facility] on 03/22/2014 with complaints of chest pain started about 45 minutes prior to presentation. He had radiation of pain down his left arm and also noted dizziness, nausea and vomiting. No diaphoresis. EKG demonstrated 2 mm ST elevations inferiorly with depressions in V1 and V2. Code STEMI was activated and he was transported urgently to the cath lab for urgent coronary catheterization. This was performed by Dr. Burt Knack. He was found to have a complex stenosis of the mid to distal right coronary artery. This was successfully treated with PCI utilizing aspiration thrombectomy and a drug-eluting stent. He was also noted to have minor nonobstructive disease involving the LAD and left circumflex. A 2-D echocardiogram was obtained post cath demonstrating mild systolic dysfunction with an estimated ejection fraction of 45% with diffuse hypokinesis, worse in the septum and apex. He was placed on dual antiplatelet therapy with aspirin and Effient along with metoprolol, Imdur and Lipitor. Zetia and fenofibrate were also added to better treat his hyperlipidemia. Triglycerides were severely elevated at 727 mg/dL. LDL was not calculated due to triglyceride levels over 400. ACE/ARB therapy was not initiated due to borderline hypotension. Of note, his Hgb A1c was also elevated at 7.6. His metformin was restarted 48 hours post-cath. He was discharged home on 03/24/2014.  He was seen in followup a few weeks ago and was doing fairly well. No adjustments were made to his medications. He's had significant weight loss. In general he's done well with smoking cessation but occasionally he does smoke some cigarettes. He is requesting nicotine  replacement today. He denies any further chest pain. He is now back to work. He is getting some mild bruising with normal activities, probably related to his dual antiplatelet therapy.  I saw Albert Villegas back in the office today for follow-up. Overall he is without complaints. He denies any chest pain or worsening shortness of breath. At his last office visit we talked about coming off of Effient. It's been more than a year since he had a stent placed and I think we can effectively do that today. I also think he would do well without isosorbide. He denies any chest pain.  08/02/2016  Albert Villegas returns today for follow-up. It's been a year since I saw him. Unfortunately he's had no cardiovascular events over the past year. He had recent blood work from his primary care provider which showed total cholesterol 193 and LDL 111. Triglycerides were 265, but known to be greater than 1000 while not on a fibrate. His hemoglobin A1c is 7.5. He has managed to lose about 8 pounds since last year but he says currently it's plateaued. He denies any worsening shortness of breath or chest pain with exertion. Given his early onset coronary disease and significant dyslipidemia suspect he has FH. Untreated cholesterol of 318. Dutch score of 8.  06/13/2018  Albert Villegas returns today for follow-up.  Overall he says he feels well and denies chest pain or worsening shortness of breath.  As suspected he is likely to have FH.  He is most recent lipid profile showed total cholesterol 204, triglycerides 223, HDL 30 and LDL 129.  This is on high intensity atorvastatin 80 mg, Zetia 10 mg and fenofibrate 160 mg daily, along with Vascepa  2 g twice daily.  Previously he had been on Praluent however it was not cost effective once he lost his job.  Now he has a new job and has reestablished insurance with [employer].  His PCP has recommended Repatha and I would agree with this as it is more likely to be covered.  He does need additional therapy to reach  target LDL.  Finally, I recommended that he have his sunscreen for dyslipidemia.  He is currently 52 years old, but if he inherited a genetic mutation it is possible that he may already have elevated LDL cholesterol.  Genetic testing is a possibility however it may be cost effective at this point.   10/24/2019  Albert Villegas is seen today in follow-up.  He has been recently difficult to get a hold of.  We wanted to repeat labs.  He did have some labs through his PCP although did a virtual visit.  His total cholesterol in May was 192, triglycerides were up at 293, HDL 34 and LDL 99.  His target LDL is less than 70, but we have had a difficult time in achieving that.  He would really benefit from a PCSK9 inhibitor, but says he is received 2 letters of denial from [facility].  We previously discussed Repatha but it does not seem we been able to pursue that.  Diabetes is also not well controlled with hemoglobin A1c of 8.5.  He denies any worsening chest pain or recurrent symptoms.  He does report some mild numbness in the left arm which is not associated with chest pain and comes and goes.  01/23/2020  Albert Villegas returns today for follow-up.  Unfortunately was originally denied Praluent, but then approved for Repatha.  He just started taking the medicine probably 2 months ago and has had 4 doses.  He has not had repeat lipids.  Blood pressure is elevated today.  He says he does not follow it regularly at home.  He does not own a blood pressure cuff and I encouraged him to purchase one.  He denies any chest pain or shortness of breath.  A repeat EKG was performed today as his prior EKG showed some new inferior T wave inversions.  Those have resolved and his EKG shows sinus rhythm today at 84.  10/21/2020  Albert Villegas returns today for follow-up.  Overall he says he is doing well.  He denies chest pain or shortness of breath.  He has had no episodes of pancreatitis.  He reports compliance with his medication regimen.  Unfortunately he  was recently denied Repatha.  He had been doing very well with an LDL of 11 back in June 2021.  His PCP is trying to work on reestablishing that.  He has not had recent lipid testing however plans to have it redrawn.  11/11/2021  Albert Villegas is seen today in follow-up.  He continues to do well.  He is back on Repatha after some issues with his insurance.  Has not had repeat lipids since then but had a very low LDL in the past.  This had gone up to 121 off of medication last summer.  He denies any chest pain or worsening shortness of breath.  He switched from Ozempic to [facility] and seems to be doing well with this.  He had lost about 22 pounds but gained about 10 back.  I have encouraged continued exercise.  12/12/2022  Albert Villegas is seen today in follow-up.  He recently has been having some musculoskeletal chest pain but  denies any exertional chest pain.  His EKG is normal today.  His cholesterol is quite low with total at 74, triglycerides 204, HDL 41 and LDL of 2.  His prior LDL was 40 on high-dose statin and Repatha and combination with fenofibrate.  Apparently his PCP had placed him on ezetimibe for unknown reasons but that likely explains the further decline in his cholesterol.  He had lost weight on Mounjaro but then gained some of it back.  His net weight today is similar to what it was in 2022.  PMHx:  Past Medical History:  Diagnosis Date   Acute renal failure 03/22/14   dehydration resolved   Depression    DM2 (diabetes mellitus, type 2)    GERD (gastroesophageal reflux disease)    Hyperlipidemia LDL goal <70    Sleep apnea in adult    Sprain and strain of cruciate ligament of knee 02/2012   left ACL   ST elevation myocardial infarction (STEMI) of inferior wall 03/22/14   stent DES to RCA   Tear of medial cartilage or meniscus of knee, current 02/2012   left   Tobacco use 03/24/2014    Past Surgical History:  Procedure Laterality Date   CORONARY ANGIOPLASTY WITH STENT PLACEMENT  03/22/14    emergent DES to RCA with thrombectomy   KNEE LIGAMENT RECONSTRUCTION     LEFT HEART CATHETERIZATION WITH CORONARY ANGIOGRAM N/A 03/22/2014   Procedure: LEFT HEART CATHETERIZATION WITH CORONARY ANGIOGRAM;  Surgeon: Blane Ohara, MD;  Location: [facility] CATH LAB;  Service: Cardiovascular;  Laterality: N/A;   REDUCTION OF TORSION OF TESTIS  age 52    FAMHx:  Family History  Problem Relation Age of Onset   Hyperlipidemia Other    Hypertension Other    Heart attack Maternal Grandfather    Heart failure Paternal Grandfather     SOCHx:   reports that he quit smoking about 8 years ago. His smoking use included cigarettes. He has a 15.00 pack-year smoking history. He has never used smokeless tobacco. He reports current alcohol use. He reports that he does not use drugs.  ALLERGIES:  No Known Allergies  ROS: Pertinent items noted in HPI and remainder of comprehensive ROS otherwise negative.  HOME MEDS: Current Outpatient Medications  Medication Sig Dispense Refill   amLODipine (NORVASC) 5 MG tablet Take 1 tablet (5 mg total) by mouth daily. 90 tablet 3   amphetamine-dextroamphetamine (ADDERALL) 20 MG tablet Take 30 mg by mouth daily.      ARIPiprazole (ABILIFY) 10 MG tablet Take 5 mg by mouth daily.      aspirin EC 81 MG tablet Take 81 mg by mouth daily.     atorvastatin (LIPITOR) 80 MG tablet Take 1 tablet (80 mg total) by mouth daily. 90 tablet 2   cyclobenzaprine (FLEXERIL) 10 MG tablet Take 10 mg by mouth 3 (three) times daily.     desvenlafaxine (PRISTIQ) 100 MG 24 hr tablet Take 50 mg by mouth every morning.     diclofenac (CATAFLAM) 50 MG tablet Take 50 mg by mouth 2 (two) times daily as needed.     empagliflozin (JARDIANCE) 25 MG TABS tablet Take 25 mg by mouth daily.     fenofibrate 160 MG tablet Take 1 tablet (160 mg total) by mouth daily. 90 tablet 3   FLUoxetine (PROZAC) 10 MG capsule Take 10 mg by mouth daily.     icosapent Ethyl (VASCEPA) 1 g capsule Take 2 capsules (2 g  total) by mouth 2 (  two) times daily. Takes 2 tablets in the morning and 2 at night 360 capsule 3   losartan (COZAAR) 50 MG tablet TAKE ONE TABLET BY MOUTH DAILY 90 tablet 2   metFORMIN (GLUCOPHAGE) 1000 MG tablet Take 1,000 mg by mouth 2 (two) times daily.  11   metoprolol tartrate (LOPRESSOR) 50 MG tablet Take 1 tablet (50 mg total) by mouth 2 (two) times daily. 180 tablet 3   MOUNJARO 10 MG/0.5ML Pen Inject into the skin.     Multiple Vitamin (MULTIVITAMIN) tablet Take 1 tablet by mouth daily.     naproxen (NAPROSYN) 500 MG tablet Take 500 mg by mouth 2 (two) times daily.     nitroGLYCERIN (NITROSTAT) 0.4 MG SL tablet Place 1 tablet (0.4 mg total) under the tongue every 5 (five) minutes x 3 doses as needed for chest pain. 25 tablet 4   REPATHA SURECLICK [id number] MG/ML SOAJ INJECT 140MG  UNDER THE SKIN EVERY 14 DAYS 6 mL 2   traZODone (DESYREL) 100 MG tablet Take 100 mg by mouth at bedtime.     No current facility-administered medications for this visit.    LABS/IMAGING: No results found for this or any previous visit (from the past 48 hour(s)). No results found.  VITALS: BP 120/72   Pulse 94   Ht 5\' 9"  (1.753 m)   Wt 235 lb 12.8 oz (107 kg)   SpO2 95%   BMI 34.82 kg/m   EXAM: General appearance: alert and no distress Neck: no carotid bruit and no JVD Lungs: clear to auscultation bilaterally Heart: regular rate and rhythm, S1, S2 normal, no murmur, click, rub or gallop Abdomen: soft, non-tender; bowel sounds normal; no masses,  no organomegaly Extremities: extremities normal, atraumatic, no cyanosis or edema Pulses: 2+ and symmetric Skin: Skin color, texture, turgor normal. No rashes or lesions Neurologic: Grossly normal Psych: Pleasant  EKG: Normal sinus rhythm at 85-personally reviewed  ASSESSMENT: CAD status post drug-eluting stent to the right coronary for ST elevation MI in 03/2014 Hypertension Familial hypercholesterolemia - Dutch score of 8 Former smoker Bipolar  disorder Type 2 diabetes  PLAN: 1.   Mr. Kemmer has a very low LDL at 2.  While this and of itself is not dangerous, it represents likely overtreatment.  Not sure why he was placed on additional medication other than elevated triglycerides however this is likely genetic and may be medication related but unlikely to improve with additional therapies.  Ezetimibe has driven his LDL lower and is really not any more beneficial than his previously treated LDL at 40.  I advised him to stop the ezetimibe today but continue on Repatha, his statin and fenofibrate as well as Vascepa.  Plan follow-up with me annually or sooner as necessary.  Will order repeat lipid in about 3 to 4 months.  Pixie Casino, MD, [facility], [place] Director of the Advanced Lipid Disorders &  Cardiovascular Risk Reduction Clinic Diplomate of the American Board of Clinical Lipidology Attending Cardiologist  Direct Dial: [phone]  Fax: [phone]  Website:  [email or web address] 12/12/2022, 3:43 PM

## 2022-12-12 NOTE — Patient Instructions (Signed)
Medication Instructions:  STOP zetia   CONTINUE all other current medications  *If you need a refill on your cardiac medications before your next appointment, please call your pharmacy*   Lab Work: FASTING lab work to check cholesterol in 4-6 months  If you have labs (blood work) drawn today and your tests are completely normal, you will receive your results only by: [place] (if you have MyChart) OR A paper copy in the mail If you have any lab test that is abnormal or we need to change your treatment, we will call you to review the results.   Testing/Procedures: NONE   Follow-Up: At [facility], you and your health needs are our priority.  As part of our continuing mission to provide you with exceptional heart care, we have created designated Provider Care Teams.  These Care Teams include your primary Cardiologist (physician) and Advanced Practice Providers (APPs -  Physician Assistants and Nurse Practitioners) who all work together to provide you with the care you need, when you need it.  We recommend signing up for the patient portal called "MyChart".  Sign up information is provided on this After Visit Summary.  MyChart is used to connect with patients for Virtual Visits (Telemedicine).  Patients are able to view lab/test results, encounter notes, upcoming appointments, etc.  Non-urgent messages can be sent to your provider as well.   To learn more about what you can do with MyChart, go to [email or web address].    Your next appointment:   12 month(s)  Provider:   Lyman Bishop MD

## 2022-12-16 ENCOUNTER — Telehealth: Payer: Self-pay | Admitting: Internal Medicine

## 2022-12-16 NOTE — Telephone Encounter (Signed)
Approved today Request Reference Number: [id number]. VASCEPA CAP 1GM is approved through 12/16/2023. Your patient may now fill this prescription and it will be covered.

## 2022-12-16 NOTE — Telephone Encounter (Signed)
PA for vascepa submitted via CMM (Key: [id number])

## 2023-03-28 ENCOUNTER — Other Ambulatory Visit: Payer: Self-pay | Admitting: Internal Medicine

## 2023-05-11 ENCOUNTER — Other Ambulatory Visit: Payer: Self-pay | Admitting: Internal Medicine

## 2023-11-17 ENCOUNTER — Other Ambulatory Visit: Payer: Self-pay | Admitting: Internal Medicine

## 2023-11-24 ENCOUNTER — Other Ambulatory Visit (HOSPITAL_COMMUNITY): Payer: Self-pay

## 2023-11-24 ENCOUNTER — Telehealth: Payer: Self-pay | Admitting: Pharmacy Technician

## 2023-11-24 NOTE — Telephone Encounter (Signed)
 Pharmacy Patient Advocate Encounter  Received notification from  [employer]  that Prior Authorization for Vascepa has been APPROVED from 11/24/23 to 11/23/24   PA #/Case ID/Reference #: [id number]

## 2023-11-24 NOTE — Telephone Encounter (Signed)
 Pharmacy Patient Advocate Encounter   Received notification from CoverMyMeds that prior authorization for Vascepa is required/requested.   Insurance verification completed.   The patient is insured through  [employer]  .   Per test claim: PA required; PA submitted to above mentioned insurance via CoverMyMeds Key/confirmation #/EOC [id number] Status is pending

## 2023-12-23 ENCOUNTER — Other Ambulatory Visit: Payer: Self-pay | Admitting: Internal Medicine

## 2024-01-25 ENCOUNTER — Other Ambulatory Visit: Payer: Self-pay | Admitting: Internal Medicine

## 2024-02-06 ENCOUNTER — Other Ambulatory Visit: Payer: Self-pay | Admitting: Internal Medicine

## 2024-02-14 ENCOUNTER — Other Ambulatory Visit: Payer: Self-pay | Admitting: Internal Medicine

## 2024-05-13 ENCOUNTER — Other Ambulatory Visit: Payer: Self-pay | Admitting: Internal Medicine

## 2024-05-15 ENCOUNTER — Other Ambulatory Visit: Payer: Self-pay | Admitting: Internal Medicine

## 2024-05-22 ENCOUNTER — Encounter: Payer: Self-pay | Admitting: Internal Medicine

## 2024-05-22 ENCOUNTER — Ambulatory Visit: Attending: Cardiovascular Disease | Admitting: Internal Medicine

## 2024-05-22 VITALS — BP 114/74 | HR 90 | Ht 69.0 in | Wt 203.4 lb

## 2024-05-22 DIAGNOSIS — E119 Type 2 diabetes mellitus without complications: Secondary | ICD-10-CM | POA: Diagnosis not present

## 2024-05-22 DIAGNOSIS — Z955 Presence of coronary angioplasty implant and graft: Secondary | ICD-10-CM | POA: Diagnosis not present

## 2024-05-22 DIAGNOSIS — E7801 Familial hypercholesterolemia: Secondary | ICD-10-CM | POA: Diagnosis not present

## 2024-05-22 DIAGNOSIS — E785 Hyperlipidemia, unspecified: Secondary | ICD-10-CM | POA: Diagnosis not present

## 2024-05-22 NOTE — Progress Notes (Signed)
 OFFICE NOTE  Chief Complaint:  Follow-up  Primary Care Physician: Loreli Elsie JONETTA Mickey., MD  HPI:  Albert Villegas is a 53 year old moderately obese male with a history of GERD, tobacco abuse, treated type 2 diabetes mellitus and hypertension who presented to [facility] on 03/22/2014 with complaints of chest pain started about 45 minutes prior to presentation. He had radiation of pain down his left arm and also noted dizziness, nausea and vomiting. No diaphoresis. EKG demonstrated 2 mm ST elevations inferiorly with depressions in V1 and V2. Code STEMI was activated and he was transported urgently to the cath lab for urgent coronary catheterization. This was performed by Dr. Wonda. He was found to have a complex stenosis of the mid to distal right coronary artery. This was successfully treated with PCI utilizing aspiration thrombectomy and a drug-eluting stent. He was also noted to have minor nonobstructive disease involving the LAD and left circumflex. A 2-D echocardiogram was obtained post cath demonstrating mild systolic dysfunction with an estimated ejection fraction of 45% with diffuse hypokinesis, worse in the septum and apex. He was placed on dual antiplatelet therapy with aspirin  and Effient  along with metoprolol , Imdur  and Lipitor . Zetia  and fenofibrate  were also added to better treat his hyperlipidemia. Triglycerides were severely elevated at 727 mg/dL. LDL was not calculated due to triglyceride levels over 400. ACE/ARB therapy was not initiated due to borderline hypotension. Of note, his Hgb A1c was also elevated at 7.6. His metformin was restarted 48 hours post-cath. He was discharged home on 03/24/2014.  He was seen in followup a few weeks ago and was doing fairly well. No adjustments were made to his medications. He's had significant weight loss. In general he's done well with smoking cessation but occasionally he does smoke some cigarettes. He is requesting nicotine   replacement today. He denies any further chest pain. He is now back to work. He is getting some mild bruising with normal activities, probably related to his dual antiplatelet therapy.  I saw Albert Villegas back in the office today for follow-up. Overall he is without complaints. He denies any chest pain or worsening shortness of breath. At his last office visit we talked about coming off of Effient . It's been more than a year since he had a stent placed and I think we can effectively do that today. I also think he would do well without isosorbide . He denies any chest pain.  08/02/2016  Albert Villegas returns today for follow-up. It's been a year since I saw him. Unfortunately he's had no cardiovascular events over the past year. He had recent blood work from his primary care provider which showed total cholesterol 193 and LDL 111. Triglycerides were 265, but known to be greater than 1000 while not on a fibrate. His hemoglobin A1c is 7.5. He has managed to lose about 8 pounds since last year but he says currently it's plateaued. He denies any worsening shortness of breath or chest pain with exertion. Given his early onset coronary disease and significant dyslipidemia suspect he has FH. Untreated cholesterol of 318. Dutch score of 8.  06/13/2018  Albert Villegas returns today for follow-up.  Overall he says he feels well and denies chest pain or worsening shortness of breath.  As suspected he is likely to have FH.  He is most recent lipid profile showed total cholesterol 204, triglycerides 223, HDL 30 and LDL 129.  This is on high intensity atorvastatin  80 mg, Zetia  10 mg and fenofibrate  160 mg daily, along with Vascepa   2 g twice daily.  Previously he had been on Praluent  however it was not cost effective once he lost his job.  Now he has a new job and has reestablished insurance with [employer].  His PCP has recommended Repatha  and I would agree with this as it is more likely to be covered.  He does need additional therapy to reach  target LDL.  Finally, I recommended that he have his sunscreen for dyslipidemia.  He is currently 53 years old, but if he inherited a genetic mutation it is possible that he may already have elevated LDL cholesterol.  Genetic testing is a possibility however it may be cost effective at this point.   10/24/2019  Albert Villegas is seen today in follow-up.  He has been recently difficult to get a hold of.  We wanted to repeat labs.  He did have some labs through his PCP although did a virtual visit.  His total cholesterol in May was 192, triglycerides were up at 293, HDL 34 and LDL 99.  His target LDL is less than 70, but we have had a difficult time in achieving that.  He would really benefit from a PCSK9 inhibitor, but says he is received 2 letters of denial from Praluent .  We previously discussed Repatha  but it does not seem we been able to pursue that.  Diabetes is also not well controlled with hemoglobin A1c of 8.5.  He denies any worsening chest pain or recurrent symptoms.  He does report some mild numbness in the left arm which is not associated with chest pain and comes and goes.  01/23/2020  Albert Villegas returns today for follow-up.  Unfortunately was originally denied Praluent , but then approved for Repatha .  He just started taking the medicine probably 2 months ago and has had 4 doses.  He has not had repeat lipids.  Blood pressure is elevated today.  He says he does not follow it regularly at home.  He does not own a blood pressure cuff and I encouraged him to purchase one.  He denies any chest pain or shortness of breath.  A repeat EKG was performed today as his prior EKG showed some new inferior T wave inversions.  Those have resolved and his EKG shows sinus rhythm today at 84.  10/21/2020  Albert Villegas returns today for follow-up.  Overall he says he is doing well.  He denies chest pain or shortness of breath.  He has had no episodes of pancreatitis.  He reports compliance with his medication regimen.  Unfortunately he  was recently denied Repatha .  He had been doing very well with an LDL of 11 back in June 2021.  His PCP is trying to work on reestablishing that.  He has not had recent lipid testing however plans to have it redrawn.  11/11/2021  Britt is seen today in follow-up.  He continues to do well.  He is back on Repatha  after some issues with his insurance.  Has not had repeat lipids since then but had a very low LDL in the past.  This had gone up to 121 off of medication last summer.  He denies any chest pain or worsening shortness of breath.  He switched from Ozempic to Mounjaro and seems to be doing well with this.  He had lost about 22 pounds but gained about 10 back.  I have encouraged continued exercise.  12/12/2022  Rider is seen today in follow-up.  He recently has been having some musculoskeletal chest pain but  denies any exertional chest pain.  His EKG is normal today.  His cholesterol is quite low with total at 74, triglycerides 204, HDL 41 and LDL of 2.  His prior LDL was 40 on high-dose statin and Repatha  and combination with fenofibrate .  Apparently his PCP had placed him on ezetimibe  for unknown reasons but that likely explains the further decline in his cholesterol.  He had lost weight on Mounjaro but then gained some of it back.  His net weight today is similar to what it was in 2022.  05/22/2024  Devynn returns today for follow-up.  He is doing very well.  He has lost substantial additional weight.  He is down to 203 from 235 according to our scale.  He is on Mounjaro.  A1c is 5.6%.  His last LDL was 2 and I had stopped his ezetimibe .  He is LDL apparently went up to 61 in May which is unusual.  I would like to repeat his lipids, trying to target his LDL less than 55.  Blood pressure is well-controlled today.  Any chest pain or worsening shortness of breath.  PMHx:  Past Medical History:  Diagnosis Date   Acute renal failure (HCC) 03/22/14   dehydration resolved   Depression    DM2 (diabetes  mellitus, type 2) (HCC)    GERD (gastroesophageal reflux disease)    Hyperlipidemia LDL goal <70    Sleep apnea in adult    Sprain and strain of cruciate ligament of knee 02/2012   left ACL   ST elevation myocardial infarction (STEMI) of inferior wall (HCC) 03/22/14   stent DES to RCA   Tear of medial cartilage or meniscus of knee, current 02/2012   left   Tobacco use 03/24/2014    Past Surgical History:  Procedure Laterality Date   CORONARY ANGIOPLASTY WITH STENT PLACEMENT  03/22/14   emergent DES to RCA with thrombectomy   KNEE LIGAMENT RECONSTRUCTION     LEFT HEART CATHETERIZATION WITH CORONARY ANGIOGRAM N/A 03/22/2014   Procedure: LEFT HEART CATHETERIZATION WITH CORONARY ANGIOGRAM;  Surgeon: Ozell JONETTA Fell, MD;  Location: [facility] CATH LAB;  Service: Cardiovascular;  Laterality: N/A;   REDUCTION OF TORSION OF TESTIS  age 61    FAMHx:  Family History  Problem Relation Age of Onset   Hyperlipidemia Other    Hypertension Other    Heart attack Maternal Grandfather    Heart failure Paternal Grandfather     SOCHx:   reports that he quit smoking about 10 years ago. His smoking use included cigarettes. He started smoking about 25 years ago. He has a 15 pack-year smoking history. He has never used smokeless tobacco. He reports current alcohol use. He reports that he does not use drugs.  ALLERGIES:  Allergies  Allergen Reactions   Bupropion     Other Reaction(s): crazy-paranoia   Omega-3-Acid Ethyl Esters     Other Reaction(s): flu like sx   Varenicline Other (See Comments)    ROS: Pertinent items noted in HPI and remainder of comprehensive ROS otherwise negative.  HOME MEDS: Current Outpatient Medications  Medication Sig Dispense Refill   icosapent  Ethyl (VASCEPA ) 1 g capsule TAKE 2 CAPSULES BY MOUTH 2 TIMES A DAY (Patient taking differently: Take 2 g by mouth daily at 6 (six) AM.) 120 capsule 0   metFORMIN (GLUCOPHAGE) 1000 MG tablet Take 1,000 mg by mouth 2 (two) times daily.  (Patient taking differently: Take 1,000 mg by mouth daily at 6 (six) AM.)  11  MOUNJARO 10 MG/0.5ML Pen Inject into the skin.     naproxen (NAPROSYN) 500 MG tablet Take 500 mg by mouth 2 (two) times daily. (Patient taking differently: Take 500 mg by mouth as needed.)     traZODone (DESYREL) 100 MG tablet Take 100 mg by mouth at bedtime. (Patient taking differently: Take 100 mg by mouth at bedtime as needed.)     amLODipine  (NORVASC ) 5 MG tablet TAKE ONE TABLET BY MOUTH DAILY 90 tablet 3   amphetamine-dextroamphetamine (ADDERALL) 20 MG tablet Take 30 mg by mouth daily.      ARIPiprazole  (ABILIFY ) 10 MG tablet Take 5 mg by mouth daily.      aspirin  EC 81 MG tablet Take 81 mg by mouth daily.     atorvastatin  (LIPITOR ) 80 MG tablet TAKE 1 TABLET BY MOUTH DAILY 30 tablet 0   cyclobenzaprine (FLEXERIL) 10 MG tablet Take 10 mg by mouth 3 (three) times daily.     desvenlafaxine  (PRISTIQ ) 100 MG 24 hr tablet Take 50 mg by mouth every morning.     diclofenac (CATAFLAM) 50 MG tablet Take 50 mg by mouth 2 (two) times daily as needed.     empagliflozin (JARDIANCE) 25 MG TABS tablet Take 25 mg by mouth daily.     fenofibrate  160 MG tablet TAKE 1 TABLET BY MOUTH DAILY 30 tablet 0   FLUoxetine (PROZAC) 10 MG capsule Take 10 mg by mouth daily.     losartan  (COZAAR ) 50 MG tablet TAKE 1 TABLET BY MOUTH DAILY 90 tablet 0   metoprolol  tartrate (LOPRESSOR ) 50 MG tablet Take 1 tablet (50 mg total) by mouth 2 (two) times daily. 180 tablet 3   Multiple Vitamin (MULTIVITAMIN) tablet Take 1 tablet by mouth daily.     nitroGLYCERIN  (NITROSTAT ) 0.4 MG SL tablet Place 1 tablet (0.4 mg total) under the tongue every 5 (five) minutes x 3 doses as needed for chest pain. (Patient not taking: Reported on 05/22/2024) 25 tablet 4   pregabalin (LYRICA) 150 MG capsule Take 150 mg by mouth 2 (two) times daily.     REPATHA  SURECLICK 140 MG/ML SOAJ INJECT 140MG  UNDER THE SKIN EVERY 14 DAYS 6 mL 3   No current facility-administered  medications for this visit.    LABS/IMAGING: No results found for this or any previous visit (from the past 48 hours). No results found.  VITALS: BP 114/74 (BP Location: Left Arm, Patient Position: Sitting, Cuff Size: Normal)   Pulse 90   Ht 5' 9 (1.753 m)   Wt 203 lb 6.4 oz (92.3 kg)   SpO2 96%   BMI 30.04 kg/m   EXAM: General appearance: alert and no distress Neck: no carotid bruit and no JVD Lungs: clear to auscultation bilaterally Heart: regular rate and rhythm, S1, S2 normal, no murmur, click, rub or gallop Abdomen: soft, non-tender; bowel sounds normal; no masses,  no organomegaly Extremities: extremities normal, atraumatic, no cyanosis or edema Pulses: 2+ and symmetric Skin: Skin color, texture, turgor normal. No rashes or lesions Neurologic: Grossly normal Psych: Pleasant  EKG: EKG Interpretation Date/Time:  Wednesday May 22 2024 09:18:59 EDT Ventricular Rate:  90 PR Interval:  150 QRS Duration:  94 QT Interval:  350 QTC Calculation: 428 R Axis:   57  Text Interpretation: Normal sinus rhythm Normal ECG When compared with ECG of 24-Mar-2014 06:50, No significant change was found Confirmed by Mona Kent [phone]) on 05/22/2024 9:32:05 AM    ASSESSMENT: CAD status post drug-eluting stent to the right coronary for  ST elevation MI in 03/2014 Hypertension Familial hypercholesterolemia - [place] score of 8, goal LDL less than 55 Former smoker Bipolar disorder Type 2 diabetes-A1c 5.6%  PLAN: 1.   Mr. Neyland is doing very well with substantial weight loss now down to 203 pounds.  Blood pressure is well-controlled.  His LDL was 2 previously but has not been reassessed although I did see some labs from May suggesting that it was 61.  Will plan to repeat his lipids since he has had further weight loss.  He will need to monitor blood pressure as we may be able to decrease his meds with additional weight loss.  He is targeting to try to get 285 pounds.  Plan  follow-up annually or sooner as necessary.  Vinie KYM Maxcy, MD, [facility], FNLA, FACP  [place]  [facility]  Medical Director of the [facility]  Card[facility]ovascular Risk Reduction Clinic Diplomate of the American Board of Clinical Lipidology Attending Cardiologist  Direct Dial: [phone]  Fax: [phone]  Website:  [email or web address] Vinie BROCKS Jonn Chaikin 05/22/2024, 9:32 AM

## 2024-05-22 NOTE — Patient Instructions (Signed)
 Medication Instructions:  NO CHANGES  *If you need a refill on your cardiac medications before your next appointment, please call your pharmacy*  Lab Work: Lipid Panel and LPa  If you have labs (blood work) drawn today and your tests are completely normal, you will receive your results only by: MyChart Message (if you have MyChart) OR A paper copy in the mail If you have any lab test that is abnormal or we need to change your treatment, we will call you to review the results.   Follow-Up: At [facility], you and your health needs are our priority.  As part of our continuing mission to provide you with exceptional heart care, our providers are all part of one team.  This team includes your primary Cardiologist (physician) and Advanced Practice Providers or APPs (Physician Assistants and Nurse Practitioners) who all work together to provide you with the care you need, when you need it.  Your next appointment:    12 months with Dr. Mona  We recommend signing up for the patient portal called MyChart.  Sign up information is provided on this After Visit Summary.  MyChart is used to connect with patients for Virtual Visits (Telemedicine).  Patients are able to view lab/test results, encounter notes, upcoming appointments, etc.  Non-urgent messages can be sent to your provider as well.   To learn more about what you can do with MyChart, go to [email or web address].   Other Instructions

## 2024-05-23 LAB — LIPID PANEL
Chol/HDL Ratio: 2 ratio (ref 0.0–5.0)
Cholesterol, Total: 116 mg/dL (ref 100–199)
HDL: 57 mg/dL (ref >39)
LDL Chol Calc (NIH): 45 mg/dL (ref 0–99)
Triglycerides: 65 mg/dL (ref 0–149)
VLDL Cholesterol Cal: 14 mg/dL (ref 5–40)

## 2024-05-23 LAB — LIPOPROTEIN A (LPA): Lipoprotein (a): <8.4 nmol/L (ref <75.0)

## 2024-05-27 ENCOUNTER — Other Ambulatory Visit: Payer: Self-pay | Admitting: Internal Medicine

## 2024-05-31 ENCOUNTER — Ambulatory Visit: Payer: Self-pay | Admitting: Internal Medicine

## 2024-07-06 ENCOUNTER — Other Ambulatory Visit: Payer: Self-pay | Admitting: Internal Medicine

## 2024-07-25 ENCOUNTER — Other Ambulatory Visit: Payer: Self-pay | Admitting: Cardiovascular Disease

## 2024-07-25 DIAGNOSIS — F17201 Nicotine dependence, unspecified, in remission: Secondary | ICD-10-CM

## 2024-08-02 ENCOUNTER — Ambulatory Visit
Admission: RE | Admit: 2024-08-02 | Discharge: 2024-08-02 | Disposition: A | Discharge status: Home / Self Care | Source: Ambulatory Visit | Attending: Cardiovascular Disease | Admitting: Cardiovascular Disease

## 2024-08-02 DIAGNOSIS — F17201 Nicotine dependence, unspecified, in remission: Secondary | ICD-10-CM

## 2024-08-04 ENCOUNTER — Other Ambulatory Visit: Payer: Self-pay | Admitting: Internal Medicine
